# Patient Record
Sex: Male | Born: 1975 | Race: Black or African American | Hispanic: No | Marital: Single | Smoking: Current every day smoker
Health system: Southern US, Community
[De-identification: ages and names within clinical notes are randomized; demographics above are authoritative.]

## PROBLEM LIST (undated history)

## (undated) DIAGNOSIS — S1191XA Laceration without foreign body of unspecified part of neck, initial encounter: Secondary | ICD-10-CM

---

## 1998-03-28 ENCOUNTER — Emergency Department (HOSPITAL_COMMUNITY): Admission: EM | Admit: 1998-03-28 | Discharge: 1998-03-29 | Payer: Self-pay | Admitting: Internal Medicine

## 1999-02-27 ENCOUNTER — Emergency Department (HOSPITAL_COMMUNITY): Admission: EM | Admit: 1999-02-27 | Discharge: 1999-02-27 | Payer: Self-pay | Admitting: *Deleted

## 2008-05-08 ENCOUNTER — Emergency Department (HOSPITAL_COMMUNITY): Admission: EM | Admit: 2008-05-08 | Discharge: 2008-05-09 | Payer: Self-pay | Admitting: Emergency Medicine

## 2008-05-19 ENCOUNTER — Emergency Department (HOSPITAL_COMMUNITY): Admission: EM | Admit: 2008-05-19 | Discharge: 2008-05-19 | Payer: Self-pay | Admitting: Emergency Medicine

## 2008-12-08 ENCOUNTER — Emergency Department (HOSPITAL_COMMUNITY): Admission: EM | Admit: 2008-12-08 | Discharge: 2008-12-08 | Payer: Self-pay | Admitting: Emergency Medicine

## 2009-08-08 ENCOUNTER — Emergency Department (HOSPITAL_COMMUNITY): Admission: EM | Admit: 2009-08-08 | Discharge: 2009-08-08 | Payer: Self-pay | Admitting: Family Medicine

## 2010-01-12 ENCOUNTER — Emergency Department (HOSPITAL_COMMUNITY): Admission: EM | Admit: 2010-01-12 | Discharge: 2010-01-12 | Payer: Self-pay | Admitting: Emergency Medicine

## 2010-06-17 ENCOUNTER — Emergency Department (HOSPITAL_COMMUNITY): Admission: EM | Admit: 2010-06-17 | Discharge: 2010-06-17 | Payer: Self-pay | Admitting: Emergency Medicine

## 2010-08-30 ENCOUNTER — Emergency Department (HOSPITAL_COMMUNITY)
Admission: EM | Admit: 2010-08-30 | Discharge: 2010-08-31 | Payer: Self-pay | Source: Home / Self Care | Admitting: Emergency Medicine

## 2011-01-10 ENCOUNTER — Emergency Department (HOSPITAL_COMMUNITY): Payer: Self-pay

## 2011-01-10 ENCOUNTER — Emergency Department (HOSPITAL_COMMUNITY)
Admission: EM | Admit: 2011-01-10 | Discharge: 2011-01-10 | Payer: Self-pay | Attending: Emergency Medicine | Admitting: Emergency Medicine

## 2011-01-10 DIAGNOSIS — S81009A Unspecified open wound, unspecified knee, initial encounter: Secondary | ICD-10-CM | POA: Insufficient documentation

## 2011-01-10 DIAGNOSIS — W3400XA Accidental discharge from unspecified firearms or gun, initial encounter: Secondary | ICD-10-CM | POA: Insufficient documentation

## 2011-05-27 HISTORY — PX: NECK SURGERY: SHX720

## 2011-06-26 ENCOUNTER — Emergency Department (HOSPITAL_COMMUNITY): Payer: Self-pay

## 2011-06-26 ENCOUNTER — Emergency Department (HOSPITAL_COMMUNITY)
Admission: EM | Admit: 2011-06-26 | Discharge: 2011-06-26 | Disposition: A | Discharge status: Home / Self Care | Payer: Self-pay | Attending: Emergency Medicine | Admitting: Emergency Medicine

## 2011-06-26 DIAGNOSIS — M542 Cervicalgia: Secondary | ICD-10-CM | POA: Insufficient documentation

## 2011-06-26 DIAGNOSIS — S0180XA Unspecified open wound of other part of head, initial encounter: Secondary | ICD-10-CM | POA: Insufficient documentation

## 2011-06-26 DIAGNOSIS — S1190XA Unspecified open wound of unspecified part of neck, initial encounter: Secondary | ICD-10-CM | POA: Insufficient documentation

## 2011-06-26 DIAGNOSIS — F1010 Alcohol abuse, uncomplicated: Secondary | ICD-10-CM | POA: Insufficient documentation

## 2011-06-26 DIAGNOSIS — D72829 Elevated white blood cell count, unspecified: Secondary | ICD-10-CM | POA: Insufficient documentation

## 2011-06-26 LAB — CBC
HCT: 33.4 % — ABNORMAL LOW (ref 39.0–52.0)
Hemoglobin: 11.3 g/dL — ABNORMAL LOW (ref 13.0–17.0)
MCH: 30.3 pg (ref 26.0–34.0)
MCHC: 33.8 g/dL (ref 30.0–36.0)
MCV: 89.5 fL (ref 78.0–100.0)
Platelets: 183 10*3/uL (ref 150–400)
RBC: 3.73 MIL/uL — ABNORMAL LOW (ref 4.22–5.81)
RDW: 12.3 % (ref 11.5–15.5)
WBC: 14.2 10*3/uL — ABNORMAL HIGH (ref 4.0–10.5)

## 2011-06-26 LAB — BASIC METABOLIC PANEL
BUN: 16 mg/dL (ref 6–23)
CO2: 20 mEq/L (ref 19–32)
Calcium: 8.4 mg/dL (ref 8.4–10.5)
Chloride: 103 mEq/L (ref 96–112)
Creatinine, Ser: 0.8 mg/dL (ref 0.50–1.35)
GFR calc Af Amer: >90 mL/min (ref >90)
GFR calc non Af Amer: >90 mL/min (ref >90)
Glucose, Bld: 92 mg/dL (ref 70–99)
Potassium: 3.4 mEq/L — ABNORMAL LOW (ref 3.5–5.1)
Sodium: 138 mEq/L (ref 135–145)

## 2011-06-26 LAB — DIFFERENTIAL
Basophils Absolute: 0 10*3/uL (ref 0.0–0.1)
Basophils Relative: 0 % (ref 0–1)
Eosinophils Absolute: 0 10*3/uL (ref 0.0–0.7)
Eosinophils Relative: 0 % (ref 0–5)
Lymphocytes Relative: 7 % — ABNORMAL LOW (ref 12–46)
Lymphs Abs: 0.9 10*3/uL (ref 0.7–4.0)
Monocytes Absolute: 0.8 10*3/uL (ref 0.1–1.0)
Monocytes Relative: 6 % (ref 3–12)
Neutro Abs: 12.5 10*3/uL — ABNORMAL HIGH (ref 1.7–7.7)
Neutrophils Relative %: 88 % — ABNORMAL HIGH (ref 43–77)

## 2011-06-26 LAB — ETHANOL: Alcohol, Ethyl (B): 251 mg/dL — ABNORMAL HIGH (ref 0–11)

## 2011-06-26 MED ORDER — IOHEXOL 350 MG/ML SOLN
50.0000 mL | Freq: Once | INTRAVENOUS | Status: AC | PRN
  Administered 2011-06-26: 50 mL via INTRAVENOUS

## 2011-06-29 ENCOUNTER — Inpatient Hospital Stay (HOSPITAL_COMMUNITY)
Admission: EM | Admit: 2011-06-29 | Discharge: 2011-07-01 | DRG: 603 | Disposition: A | Discharge status: Home / Self Care | Payer: Self-pay | Attending: General Surgery | Admitting: General Surgery

## 2011-06-29 ENCOUNTER — Emergency Department (HOSPITAL_COMMUNITY): Payer: Self-pay

## 2011-06-29 ENCOUNTER — Emergency Department (HOSPITAL_COMMUNITY)
Admission: EM | Admit: 2011-06-29 | Discharge: 2011-06-29 | Disposition: A | Discharge status: Other Acute Inpatient Hospital | Payer: Self-pay | Attending: Emergency Medicine | Admitting: Emergency Medicine

## 2011-06-29 DIAGNOSIS — L03221 Cellulitis of neck: Secondary | ICD-10-CM

## 2011-06-29 DIAGNOSIS — S1191XA Laceration without foreign body of unspecified part of neck, initial encounter: Secondary | ICD-10-CM | POA: Diagnosis present

## 2011-06-29 DIAGNOSIS — Z9889 Other specified postprocedural states: Secondary | ICD-10-CM | POA: Insufficient documentation

## 2011-06-29 DIAGNOSIS — L0211 Cutaneous abscess of neck: Secondary | ICD-10-CM | POA: Insufficient documentation

## 2011-06-29 DIAGNOSIS — S1190XA Unspecified open wound of unspecified part of neck, initial encounter: Secondary | ICD-10-CM

## 2011-06-29 DIAGNOSIS — F1010 Alcohol abuse, uncomplicated: Secondary | ICD-10-CM | POA: Diagnosis present

## 2011-06-29 DIAGNOSIS — T8140XA Infection following a procedure, unspecified, initial encounter: Secondary | ICD-10-CM

## 2011-06-29 DIAGNOSIS — D62 Acute posthemorrhagic anemia: Secondary | ICD-10-CM | POA: Diagnosis present

## 2011-06-29 DIAGNOSIS — T148XXA Other injury of unspecified body region, initial encounter: Secondary | ICD-10-CM | POA: Diagnosis present

## 2011-06-29 DIAGNOSIS — R220 Localized swelling, mass and lump, head: Secondary | ICD-10-CM | POA: Insufficient documentation

## 2011-06-29 DIAGNOSIS — F17200 Nicotine dependence, unspecified, uncomplicated: Secondary | ICD-10-CM | POA: Diagnosis present

## 2011-06-29 LAB — POCT I-STAT, CHEM 8
Chloride: 104 mEq/L (ref 96–112)
Creatinine, Ser: 0.9 mg/dL (ref 0.50–1.35)
Glucose, Bld: 88 mg/dL (ref 70–99)
HCT: 35 % — ABNORMAL LOW (ref 39.0–52.0)
Potassium: 3.5 mEq/L (ref 3.5–5.1)
Sodium: 138 mEq/L (ref 135–145)

## 2011-06-29 LAB — DIFFERENTIAL
Eosinophils Relative: 1 % (ref 0–5)
Lymphocytes Relative: 5 % — ABNORMAL LOW (ref 12–46)
Lymphs Abs: 0.7 10*3/uL (ref 0.7–4.0)
Monocytes Relative: 10 % (ref 3–12)
Neutro Abs: 12.2 10*3/uL — ABNORMAL HIGH (ref 1.7–7.7)

## 2011-06-29 LAB — CBC
HCT: 28.4 % — ABNORMAL LOW (ref 39.0–52.0)
Hemoglobin: 10 g/dL — ABNORMAL LOW (ref 13.0–17.0)
MCHC: 35.2 g/dL (ref 30.0–36.0)
RBC: 3.18 MIL/uL — ABNORMAL LOW (ref 4.22–5.81)

## 2011-06-29 MED ORDER — MORPHINE SULFATE 2 MG/ML IJ SOLN
1.0000 mg | INTRAMUSCULAR | Status: DC | PRN
  Administered 2011-06-29: 2 mg via INTRAVENOUS

## 2011-06-29 MED ORDER — IOHEXOL 300 MG/ML  SOLN
100.0000 mL | Freq: Once | INTRAMUSCULAR | Status: AC | PRN
  Administered 2011-06-29: 100 mL via INTRAVENOUS

## 2011-06-29 MED ORDER — SODIUM CHLORIDE 0.9 % IV SOLN
3.0000 g | Freq: Four times a day (QID) | INTRAVENOUS | Status: DC
  Administered 2011-06-30: 3 g via INTRAVENOUS
  Filled 2011-06-29 (×3): qty 3

## 2011-06-29 MED ORDER — ONDANSETRON HCL 4 MG/2ML IJ SOLN: 4.0000 mg | Freq: Four times a day (QID) | INTRAMUSCULAR | Status: DC | PRN

## 2011-06-29 MED ORDER — DEXTROSE-NACL 5-0.9 % IV SOLN
INTRAVENOUS | Status: DC
  Administered 2011-06-30: 05:00:00 via INTRAVENOUS

## 2011-06-30 DIAGNOSIS — Z720 Tobacco use: Secondary | ICD-10-CM | POA: Insufficient documentation

## 2011-06-30 DIAGNOSIS — L089 Local infection of the skin and subcutaneous tissue, unspecified: Secondary | ICD-10-CM | POA: Diagnosis present

## 2011-06-30 DIAGNOSIS — Z7289 Other problems related to lifestyle: Secondary | ICD-10-CM | POA: Insufficient documentation

## 2011-06-30 DIAGNOSIS — S1191XA Laceration without foreign body of unspecified part of neck, initial encounter: Secondary | ICD-10-CM | POA: Diagnosis present

## 2011-06-30 DIAGNOSIS — D62 Acute posthemorrhagic anemia: Secondary | ICD-10-CM | POA: Diagnosis present

## 2011-06-30 LAB — BASIC METABOLIC PANEL
BUN: 10 mg/dL (ref 6–23)
CO2: 25 mEq/L (ref 19–32)
Calcium: 8.6 mg/dL (ref 8.4–10.5)
Creatinine, Ser: 0.87 mg/dL (ref 0.50–1.35)
GFR calc non Af Amer: >90 mL/min (ref >90)
Glucose, Bld: 98 mg/dL (ref 70–99)
Sodium: 140 mEq/L (ref 135–145)

## 2011-06-30 LAB — CBC
Hemoglobin: 9.3 g/dL — ABNORMAL LOW (ref 13.0–17.0)
MCH: 31.2 pg (ref 26.0–34.0)
MCHC: 35 g/dL (ref 30.0–36.0)
MCV: 89.3 fL (ref 78.0–100.0)
RBC: 2.98 MIL/uL — ABNORMAL LOW (ref 4.22–5.81)

## 2011-06-30 MED ORDER — MORPHINE SULFATE 2 MG/ML IJ SOLN: 1.0000 mg | INTRAMUSCULAR | Status: DC | PRN

## 2011-06-30 MED ORDER — MORPHINE SULFATE 2 MG/ML IJ SOLN: 2.0000 mg | INTRAMUSCULAR | Status: DC | PRN

## 2011-06-30 MED ORDER — HYDROCODONE-ACETAMINOPHEN 5-325 MG PO TABS
0.5000 | ORAL_TABLET | ORAL | Status: DC | PRN
  Administered 2011-06-30: 2 via ORAL
  Filled 2011-06-30: qty 2

## 2011-06-30 MED ORDER — MORPHINE SULFATE 4 MG/ML IJ SOLN: 3.0000 mg | INTRAMUSCULAR | Status: DC | PRN

## 2011-06-30 MED ORDER — AMOXICILLIN-POT CLAVULANATE 875-125 MG PO TABS
1.0000 | ORAL_TABLET | Freq: Two times a day (BID) | ORAL | Status: DC
  Administered 2011-06-30 – 2011-07-01 (×3): 1 via ORAL
  Filled 2011-06-30 (×5): qty 1
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
## 2011-07-01 MED ORDER — AMOXICILLIN-POT CLAVULANATE 875-125 MG PO TABS: 1.0000 | ORAL_TABLET | Freq: Two times a day (BID) | ORAL | Status: AC

## 2011-07-01 MED ORDER — HYDROCODONE-ACETAMINOPHEN 5-325 MG PO TABS: 0.5000 | ORAL_TABLET | ORAL | Status: AC | PRN
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
## 2011-07-01 NOTE — Discharge Summary (Signed)
This patient has been seen and I agree with the findings and treatment plan.  Depaul Arizpe O. Shatasha Lambing, III, MD, FACS [phone] (pager) [phone] (direct pager) Trauma Surgeon  

## 2011-07-01 NOTE — Progress Notes (Signed)
Patient discharged home. Home discharge education, no questions verbalized. Patient is alert and oriented, not in any distress, denies pain.
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
## 2011-07-02 LAB — CULTURE, ROUTINE-ABSCESS

## 2011-07-03 NOTE — ED Notes (Addendum)
+   Wound Culture-Patient treated with Augmentin-chart sent to EDP office for review.

## 2011-07-05 ENCOUNTER — Telehealth (HOSPITAL_COMMUNITY): Payer: Self-pay | Admitting: Emergency Medicine

## 2011-07-05 NOTE — ED Notes (Signed)
+   wound abscess Chart reviewed by Rhea Bleacher. Augmentin is adequate coverage for staph /strept infection. Please verify if patient is taking his abx.

## 2011-07-11 NOTE — ED Notes (Signed)
Unable to contact via phone. Letter sent to Epic address. 

## 2011-11-01 ENCOUNTER — Emergency Department (HOSPITAL_COMMUNITY)
Admission: EM | Admit: 2011-11-01 | Discharge: 2011-11-02 | Disposition: A | Discharge status: Rehabilitation Facility | Payer: Self-pay | Attending: Emergency Medicine | Admitting: Emergency Medicine

## 2011-11-01 ENCOUNTER — Encounter (HOSPITAL_COMMUNITY): Payer: Self-pay | Admitting: Emergency Medicine

## 2011-11-01 DIAGNOSIS — F10939 Alcohol use, unspecified with withdrawal, unspecified: Secondary | ICD-10-CM | POA: Insufficient documentation

## 2011-11-01 DIAGNOSIS — F10239 Alcohol dependence with withdrawal, unspecified: Secondary | ICD-10-CM | POA: Insufficient documentation

## 2011-11-01 DIAGNOSIS — F1910 Other psychoactive substance abuse, uncomplicated: Secondary | ICD-10-CM | POA: Insufficient documentation

## 2011-11-01 DIAGNOSIS — F1020 Alcohol dependence, uncomplicated: Secondary | ICD-10-CM | POA: Insufficient documentation

## 2011-11-01 LAB — RAPID URINE DRUG SCREEN, HOSP PERFORMED
Amphetamines: NOT DETECTED
Benzodiazepines: NOT DETECTED
Cocaine: POSITIVE — AB
Opiates: NOT DETECTED
Tetrahydrocannabinol: NOT DETECTED

## 2011-11-01 LAB — URINALYSIS, ROUTINE W REFLEX MICROSCOPIC
Glucose, UA: NEGATIVE mg/dL
Hgb urine dipstick: NEGATIVE
Leukocytes, UA: NEGATIVE
Protein, ur: NEGATIVE mg/dL
Urobilinogen, UA: 1 mg/dL (ref 0.0–1.0)

## 2011-11-01 LAB — CBC
MCH: 31.7 pg (ref 26.0–34.0)
MCHC: 34.6 g/dL (ref 30.0–36.0)
MCV: 91.7 fL (ref 78.0–100.0)
Platelets: 262 10*3/uL (ref 150–400)
RBC: 3.97 MIL/uL — ABNORMAL LOW (ref 4.22–5.81)
RDW: 13.6 % (ref 11.5–15.5)

## 2011-11-01 LAB — DIFFERENTIAL
Basophils Relative: 0 % (ref 0–1)
Eosinophils Absolute: 0.1 10*3/uL (ref 0.0–0.7)
Eosinophils Relative: 1 % (ref 0–5)
Lymphs Abs: 1.8 10*3/uL (ref 0.7–4.0)
Neutrophils Relative %: 56 % (ref 43–77)

## 2011-11-01 LAB — BASIC METABOLIC PANEL
Calcium: 9.4 mg/dL (ref 8.4–10.5)
GFR calc Af Amer: >90 mL/min (ref >90)
GFR calc non Af Amer: 86 mL/min — ABNORMAL LOW (ref >90)
Glucose, Bld: 123 mg/dL — ABNORMAL HIGH (ref 70–99)
Potassium: 4 mEq/L (ref 3.5–5.1)
Sodium: 134 mEq/L — ABNORMAL LOW (ref 135–145)

## 2011-11-01 MED ORDER — FOLIC ACID 1 MG PO TABS
1.0000 mg | ORAL_TABLET | Freq: Every day | ORAL | Status: DC
  Administered 2011-11-01: 1 mg via ORAL
  Filled 2011-11-01: qty 1

## 2011-11-01 MED ORDER — LORAZEPAM 1 MG PO TABS
1.0000 mg | ORAL_TABLET | Freq: Once | ORAL | Status: AC
  Administered 2011-11-01: 1 mg via ORAL
  Filled 2011-11-01: qty 1

## 2011-11-01 MED ORDER — ONDANSETRON HCL 4 MG PO TABS: 4.0000 mg | ORAL_TABLET | Freq: Three times a day (TID) | ORAL | Status: DC | PRN

## 2011-11-01 MED ORDER — ALUM & MAG HYDROXIDE-SIMETH 200-200-20 MG/5ML PO SUSP: 30.0000 mL | ORAL | Status: DC | PRN

## 2011-11-01 MED ORDER — IBUPROFEN 600 MG PO TABS: 600.0000 mg | ORAL_TABLET | Freq: Three times a day (TID) | ORAL | Status: DC | PRN

## 2011-11-01 MED ORDER — ADULT MULTIVITAMIN W/MINERALS CH
1.0000 | ORAL_TABLET | Freq: Every day | ORAL | Status: DC
  Administered 2011-11-01: 1 via ORAL
  Filled 2011-11-01: qty 1

## 2011-11-01 MED ORDER — NICOTINE 21 MG/24HR TD PT24
21.0000 mg | MEDICATED_PATCH | Freq: Every day | TRANSDERMAL | Status: DC
  Administered 2011-11-01: 21 mg via TRANSDERMAL
  Filled 2011-11-01: qty 1

## 2011-11-01 MED ORDER — LORAZEPAM 1 MG PO TABS: 0.0000 mg | ORAL_TABLET | Freq: Four times a day (QID) | ORAL | Status: DC

## 2011-11-01 MED ORDER — LORAZEPAM 1 MG PO TABS: 0.0000 mg | ORAL_TABLET | Freq: Two times a day (BID) | ORAL | Status: DC

## 2011-11-01 MED ORDER — THIAMINE HCL 100 MG/ML IJ SOLN: 100.0000 mg | Freq: Every day | INTRAMUSCULAR | Status: DC

## 2011-11-01 MED ORDER — VITAMIN B-1 100 MG PO TABS
100.0000 mg | ORAL_TABLET | Freq: Every day | ORAL | Status: DC
  Administered 2011-11-01: 100 mg via ORAL
  Filled 2011-11-01: qty 1

## 2011-11-01 NOTE — ED Provider Notes (Signed)
History     CSN: [id number]  Arrival date & time 11/01/11  1803   First MD Initiated Contact with Patient 11/01/11 1848      Chief Complaint  Patient presents with  . Medical Clearance    wants detox from ETOH, crack and marajuana    (Consider location/radiation/quality/duration/timing/severity/associated sxs/prior treatment) HPI History provided by pt.   Pt requests detox from alcohol.  Wakes every morning w/ tremors and diaphoresis which resolves as soon as he drinks a beer, and he does not stop drinking until he goes to bed.  Has been drinking on a daily basis for the past 2 months.  Last drink yesterday evening.  Currently experiencing tremors, paresthesias of hands, diaphoresis, abdominal cramping and nausea.  Denies hallucinations.  No prior h/o DTs/seizures.  Pt also abuses marijuana and cocaine.  Attempted self-detox several months ago and was clean for two months but began to abuse again after going through a stressful situation. Denies SI/HI.    History reviewed. No pertinent past medical history.  History reviewed. No pertinent past surgical history.  No family history on file.  History  Substance Use Topics  . Smoking status: Not on file  . Smokeless tobacco: Not on file  . Alcohol Use: Not on file      Review of Systems  All other systems reviewed and are negative.    Allergies  Review of patient's allergies indicates no known allergies.  Home Medications  No current outpatient prescriptions on file.  BP 128/71  Pulse 117  Temp 99.3 F (37.4 C)  Resp 20  SpO2 99%  Physical Exam  Nursing note and vitals reviewed. Constitutional: He is oriented to person, place, and time. He appears well-developed and well-nourished. No distress.  HENT:  Head: Normocephalic and atraumatic.  Eyes:       Normal appearance  Neck: Normal range of motion.  Cardiovascular: Normal rate and regular rhythm.   Pulmonary/Chest: Effort normal and breath sounds normal.    Neurological: He is alert and oriented to person, place, and time.       Slight, bilateral hand tremor  Skin: Skin is warm and dry. No rash noted.       No diaphoresis  Psychiatric: He has a normal mood and affect. His behavior is normal.    ED Course  Procedures (including critical care time)  Labs Reviewed  URINE RAPID DRUG SCREEN (HOSP PERFORMED) - Abnormal; Notable for the following:    Cocaine POSITIVE (*)    All other components within normal limits  CBC - Abnormal; Notable for the following:    RBC 3.97 (*)    Hemoglobin 12.6 (*)    HCT 36.4 (*)    All other components within normal limits  DIFFERENTIAL - Abnormal; Notable for the following:    Monocytes Relative 14 (*)    All other components within normal limits  BASIC METABOLIC PANEL - Abnormal; Notable for the following:    Sodium 134 (*)    Glucose, Bld 123 (*)    GFR calc non Af Amer 86 (*)    All other components within normal limits  URINALYSIS, ROUTINE W REFLEX MICROSCOPIC - Abnormal; Notable for the following:    Ketones, ur TRACE (*)    All other components within normal limits  ETHANOL   No results found.   1. Alcoholism   2. Polysubstance abuse       MDM  Pt presents for alcohol detox.  Experiencing withdrawal sx currently.  Abuses several other substances.  Denies SI/HI.  Mobile Crisis Team is present and will find placement for patient but he needs medical clearance.  Labs pending.  Psych holding and alcohol withdrawal orders have been written.  ACT team consulted.          Otilio Miu, [place] 11/01/11 2210

## 2011-11-01 NOTE — ED Notes (Addendum)
Wants detox from ETOH, crack, cocaine and marajuana.  Denies SI or HI.  Pt called mobile crisis unit who is here now.  Last used ETOH, crack and cocaine last night.  Last Brooke Bonito was about 3 days ago.  Also c/o nause, muscle cramps, sweats, tremors/shakes, numbness to hands.  Denies hallucinations.

## 2011-11-01 NOTE — ED Notes (Signed)
Pt is here for medical clearance for Mobile Crisis for cocaine and alcohol addiction.

## 2011-11-02 NOTE — Discharge Instructions (Signed)
Chemical Dependency Chemical dependency is an addiction to drugs or alcohol. It is characterized by the repeated behavior of seeking out and using drugs and alcohol despite harmful consequences to the health and safety of ones self and others.  RISK FACTORS There are certain situations or behaviors that increase a person's risk for chemical dependency. These include:  A family history of chemical dependency.   A history of mental health issues, including depression and anxiety.   A home environment where drugs and alcohol are easily available to you.   Drug or alcohol use at a young age.  SYMPTOMS  The following symptoms can indicate chemical dependency:  Inability to limit the use of drugs or alcohol.   Nausea, sweating, shakiness, and anxiety that occurs when alcohol or drugs are not being used.   An increase in amount of drugs or alcohol that is necessary to get drunk or high.  People who experience these symptoms can assess their use of drugs and alcohol by asking themselves the following questions:  Have you been told by friends or family that they are worried about your use of alcohol or drugs?   Do friends and family ever tell you about things you did while drinking alcohol or using drugs that you do not remember?   Do you lie about using alcohol or drugs or about the amounts you use?   Do you have difficulty completing daily tasks unless you use alcohol or drugs?   Is the level of your work or school performance lower because of your drug or alcohol use?   Do you get sick from using drugs or alcohol but keep using anyway?   Do you feel uncomfortable in social situations unless you use alcohol or drugs?   Do you use drugs or alcohol to help forget problems?  An answer of yes to any of these questions may indicate chemical dependency. Professional evaluation is suggested. Document Released: 08/06/2001 Document Revised: 08/01/2011 Document Reviewed: 10/18/2010 ExitCare  Patient Information 2012 [employer]. 

## 2011-11-02 NOTE — ED Provider Notes (Signed)
Medical screening examination/treatment/procedure(s) were performed by non-physician practitioner and as supervising physician I was immediately available for consultation/collaboration.  Geoffery Lyons, MD 11/02/11 [phone]

## 2011-12-06 ENCOUNTER — Encounter (HOSPITAL_BASED_OUTPATIENT_CLINIC_OR_DEPARTMENT_OTHER): Payer: Self-pay | Admitting: *Deleted

## 2011-12-06 ENCOUNTER — Emergency Department (HOSPITAL_BASED_OUTPATIENT_CLINIC_OR_DEPARTMENT_OTHER)
Admission: EM | Admit: 2011-12-06 | Discharge: 2011-12-06 | Disposition: A | Discharge status: Home / Self Care | Payer: Self-pay | Attending: Emergency Medicine | Admitting: Emergency Medicine

## 2011-12-06 DIAGNOSIS — H109 Unspecified conjunctivitis: Secondary | ICD-10-CM

## 2011-12-06 DIAGNOSIS — H00019 Hordeolum externum unspecified eye, unspecified eyelid: Secondary | ICD-10-CM | POA: Insufficient documentation

## 2011-12-06 DIAGNOSIS — F17200 Nicotine dependence, unspecified, uncomplicated: Secondary | ICD-10-CM | POA: Insufficient documentation

## 2011-12-06 MED ORDER — TOBRAMYCIN 0.3 % OP SOLN: 2.0000 [drp] | OPHTHALMIC | Status: DC

## 2011-12-06 MED ORDER — TOBRAMYCIN 0.3 % OP SOLN
OPHTHALMIC | Status: AC
  Filled 2011-12-06: qty 5

## 2011-12-06 NOTE — Discharge Instructions (Signed)
Conjunctivitis  Conjunctivitis is commonly called "pink eye." Conjunctivitis can be caused by bacterial or viral infection, allergies, or injuries. There is usually redness of the lining of the eye, itching, discomfort, and sometimes discharge. There may be deposits of matter along the eyelids. A viral infection usually causes a watery discharge, while a bacterial infection causes a yellowish, thick discharge. Pink eye is very contagious and spreads by direct contact.  You may be given antibiotic eyedrops as part of your treatment. Before using your eye medicine, remove all drainage from the eye by washing gently with warm water and cotton balls. Continue to use the medication until you have awakened 2 mornings in a row without discharge from the eye. Do not rub your eye. This increases the irritation and helps spread infection. Use separate towels from other household members. Wash your hands with soap and water before and after touching your eyes. Use cold compresses to reduce pain and sunglasses to relieve irritation from light. Do not wear contact lenses or wear eye makeup until the infection is gone.  SEEK MEDICAL CARE IF:    Your symptoms are not better after 3 days of treatment.   You have increased pain or trouble seeing.   The outer eyelids become very red or swollen.  Document Released: 09/19/2004 Document Revised: 08/01/2011 Document Reviewed: 08/12/2005  ExitCare Patient Information 2012 [employer].  Sty  A sty (hordeolum) is an infection of a gland in the eyelid located at the base of the eyelash. A sty may develop a white or yellow head of pus. It can be puffy (swollen). Usually, the sty will burst and pus will come out on its own. They do not leave lumps in the eyelid once they drain.  A sty is often confused with another form of cyst of the eyelid called a chalazion. Chalazions occur within the eyelid and not on the edge where the bases of the eyelashes are. They often are red, sore and then  form firm lumps in the eyelid.  CAUSES    Germs (bacteria).   Lasting (chronic) eyelid inflammation.  SYMPTOMS    Tenderness, redness and swelling along the edge of the eyelid at the base of the eyelashes.   Sometimes, there is a white or yellow head of pus. It may or may not drain.  DIAGNOSIS   An ophthalmologist will be able to distinguish between a sty and a chalazion and treat the condition appropriately.   TREATMENT    Styes are typically treated with warm packs (compresses) until drainage occurs.   In rare cases, medicines that kill germs (antibiotics) may be prescribed. These antibiotics may be in the form of drops, cream or pills.   If a hard lump has formed, it is generally necessary to do a small incision and remove the hardened contents of the cyst in a minor surgical procedure done in the office.   In suspicious cases, your caregiver may send the contents of the cyst to the lab to be certain that it is not a rare, but dangerous form of cancer of the glands of the eyelid.  HOME CARE INSTRUCTIONS    Wash your hands often and dry them with a clean towel. Avoid touching your eyelid. This may spread the infection to other parts of the eye.   Apply heat to your eyelid for 10 to 20 minutes, several times a day, to ease pain and help to heal it faster.   Do not squeeze the sty. Allow it   to drain on its own. Wash your eyelid carefully 3 to 4 times per day to remove any pus.  SEEK IMMEDIATE MEDICAL CARE IF:    Your eye becomes painful or puffy (swollen).   Your vision changes.   Your sty does not drain by itself within 3 days.   Your sty comes back within a short period of time, even with treatment.   You have redness (inflammation) around the eye.   You have a fever.  Document Released: 05/22/2005 Document Revised: 08/01/2011 Document Reviewed: 01/24/2009  ExitCare Patient Information 2012 [employer].

## 2011-12-06 NOTE — ED Notes (Signed)
Eyes red swollen and painful. Things his seasonal allergies are the problem.

## 2011-12-06 NOTE — ED Provider Notes (Signed)
Medical screening examination/treatment/procedure(s) were performed by non-physician practitioner and as supervising physician I was immediately available for consultation/collaboration.   Glynn Octave, MD 12/06/11 1515

## 2011-12-06 NOTE — ED Notes (Signed)
Pt states he is a patient at [facility] drug rehab facility.

## 2011-12-06 NOTE — ED Provider Notes (Signed)
History     CSN: [id number]  Arrival date & time 12/06/11  1316   First MD Initiated Contact with Patient 12/06/11 1329      Chief Complaint  Patient presents with  . Conjunctivitis    (Consider location/radiation/quality/duration/timing/severity/associated sxs/prior treatment) Patient is a 36 y.o. male presenting with conjunctivitis. The history is provided by the patient. No language interpreter was used.  Conjunctivitis  The current episode started 5 to 7 days ago. The onset was gradual. The problem has been gradually worsening. The problem is moderate. The symptoms are relieved by nothing. The symptoms are aggravated by nothing. Associated symptoms include eye itching, eye discharge and eye redness. The eye pain is moderate. There is pain in both eyes. The eye pain is not associated with movement. He has been eating and drinking normally.  Pt has styes on bilat lower eyelids.  Pt complains of redness to eyes  History reviewed. No pertinent past medical history.  History reviewed. No pertinent past surgical history.  No family history on file.  History  Substance Use Topics  . Smoking status: Current Everyday Smoker  . Smokeless tobacco: Not on file  . Alcohol Use: Yes      Review of Systems  Eyes: Positive for discharge, redness and itching.  All other systems reviewed and are negative.    Allergies  Review of patient's allergies indicates no known allergies.  Home Medications  No current outpatient prescriptions on file.  BP 116/68  Pulse 90  Temp(Src) 98.4 F (36.9 C) (Oral)  Resp 22  SpO2 99%  Physical Exam  Constitutional: He appears well-developed and well-nourished.  HENT:  Head: Normocephalic and atraumatic.  Right Ear: External ear normal.  Left Ear: External ear normal.  Nose: Nose normal.  Mouth/Throat: Oropharynx is clear and moist.  Eyes:       Injected bilat conjunctiva,  Stye's on both lower eyelids  Neck: Normal range of motion. Neck  supple.  Musculoskeletal: Normal range of motion.  Skin: Skin is warm.  Psychiatric: He has a normal mood and affect.    ED Course  Procedures (including critical care time)  Labs Reviewed - No data to display No results found.   No diagnosis found.    MDM  tobrex drops given here.   Pt advised zyrtec for allergy like symptoms.      Lonia Skinner [place], [place] 12/06/11 1413

## 2011-12-13 ENCOUNTER — Telehealth (HOSPITAL_BASED_OUTPATIENT_CLINIC_OR_DEPARTMENT_OTHER): Payer: Self-pay | Admitting: *Deleted

## 2011-12-13 NOTE — ED Notes (Signed)
Patient called to state he was seen here on 12/08/11 and treated for an eye infection.  States he was supposed to receive a prescription for additional medications.  Reviewed chart and found no other prescriptions that were prescribed.  Called [facility] and spoke with the Social Worker, Casimiro Needle.  Info given about no other prescriptions and if patient is still having problems, he is to follow up with his pcp.

## 2012-03-17 ENCOUNTER — Emergency Department (HOSPITAL_COMMUNITY): Payer: Self-pay

## 2012-03-17 ENCOUNTER — Observation Stay (HOSPITAL_COMMUNITY)
Admission: EM | Admit: 2012-03-17 | Discharge: 2012-03-18 | Disposition: A | Discharge status: Home / Self Care | Payer: Self-pay

## 2012-03-17 ENCOUNTER — Encounter (HOSPITAL_COMMUNITY): Payer: Self-pay

## 2012-03-17 DIAGNOSIS — Y9289 Other specified places as the place of occurrence of the external cause: Secondary | ICD-10-CM | POA: Insufficient documentation

## 2012-03-17 DIAGNOSIS — D62 Acute posthemorrhagic anemia: Secondary | ICD-10-CM | POA: Diagnosis not present

## 2012-03-17 DIAGNOSIS — Y998 Other external cause status: Secondary | ICD-10-CM | POA: Insufficient documentation

## 2012-03-17 DIAGNOSIS — S31109A Unspecified open wound of abdominal wall, unspecified quadrant without penetration into peritoneal cavity, initial encounter: Secondary | ICD-10-CM

## 2012-03-17 DIAGNOSIS — S21219A Laceration without foreign body of unspecified back wall of thorax without penetration into thoracic cavity, initial encounter: Secondary | ICD-10-CM | POA: Diagnosis present

## 2012-03-17 DIAGNOSIS — S21209A Unspecified open wound of unspecified back wall of thorax without penetration into thoracic cavity, initial encounter: Principal | ICD-10-CM | POA: Insufficient documentation

## 2012-03-17 HISTORY — DX: Laceration without foreign body of unspecified part of neck, initial encounter: S11.91XA

## 2012-03-17 LAB — POCT I-STAT, CHEM 8
BUN: 13 mg/dL (ref 6–23)
Calcium, Ion: 1.13 mmol/L (ref 1.12–1.23)
Chloride: 108 mEq/L (ref 96–112)
Creatinine, Ser: 1.6 mg/dL — ABNORMAL HIGH (ref 0.50–1.35)
Glucose, Bld: 85 mg/dL (ref 70–99)
HCT: 42 % (ref 39.0–52.0)
Hemoglobin: 14.3 g/dL (ref 13.0–17.0)
Potassium: 3.8 mEq/L (ref 3.5–5.1)
Sodium: 139 meq/L (ref 135–145)
TCO2: 13 mmol/L (ref 0–100)

## 2012-03-17 LAB — URINALYSIS, MICROSCOPIC ONLY
Bilirubin Urine: NEGATIVE
Glucose, UA: NEGATIVE mg/dL
Specific Gravity, Urine: 1.015 (ref 1.005–1.030)
Urobilinogen, UA: 0.2 mg/dL (ref 0.0–1.0)

## 2012-03-17 LAB — ABO/RH: ABO/RH(D): A POS

## 2012-03-17 LAB — RAPID URINE DRUG SCREEN, HOSP PERFORMED
Amphetamines: NOT DETECTED
Barbiturates: NOT DETECTED
Benzodiazepines: NOT DETECTED
Cocaine: POSITIVE — AB
Opiates: NOT DETECTED
Tetrahydrocannabinol: POSITIVE — AB

## 2012-03-17 LAB — CBC
HCT: 38.8 % — ABNORMAL LOW (ref 39.0–52.0)
Hemoglobin: 13.5 g/dL (ref 13.0–17.0)
MCHC: 34.8 g/dL (ref 30.0–36.0)
MCV: 91.9 fL (ref 78.0–100.0)
RDW: 13.1 % (ref 11.5–15.5)
WBC: 8.7 10*3/uL (ref 4.0–10.5)

## 2012-03-17 LAB — COMPREHENSIVE METABOLIC PANEL
ALT: 14 U/L (ref 0–53)
Albumin: 4 g/dL (ref 3.5–5.2)
Alkaline Phosphatase: 69 U/L (ref 39–117)
Calcium: 9 mg/dL (ref 8.4–10.5)
Potassium: 3.8 mEq/L (ref 3.5–5.1)
Sodium: 138 mEq/L (ref 135–145)
Total Protein: 8 g/dL (ref 6.0–8.3)

## 2012-03-17 LAB — ETHANOL: Alcohol, Ethyl (B): 194 mg/dL — ABNORMAL HIGH (ref 0–11)

## 2012-03-17 LAB — LACTIC ACID, PLASMA: Lactic Acid, Venous: 13.9 mmol/L — ABNORMAL HIGH (ref 0.5–2.2)

## 2012-03-17 MED ORDER — SODIUM CHLORIDE 0.9 % IV BOLUS (SEPSIS): 1000.0000 mL | Freq: Once | INTRAVENOUS | Status: DC

## 2012-03-17 MED ORDER — IOHEXOL 300 MG/ML  SOLN
100.0000 mL | Freq: Once | INTRAMUSCULAR | Status: AC | PRN
  Administered 2012-03-17: 100 mL via INTRAVENOUS

## 2012-03-17 MED ORDER — CEFAZOLIN SODIUM 1-5 GM-% IV SOLN
1.0000 g | Freq: Once | INTRAVENOUS | Status: AC
  Administered 2012-03-17: 1 g via INTRAVENOUS
  Filled 2012-03-17: qty 50

## 2012-03-17 MED ORDER — FENTANYL CITRATE 0.05 MG/ML IJ SOLN
50.0000 ug | Freq: Once | INTRAMUSCULAR | Status: AC
  Administered 2012-03-17: 50 ug via INTRAVENOUS
  Filled 2012-03-17: qty 2

## 2012-03-17 MED ORDER — HYDROMORPHONE HCL PF 1 MG/ML IJ SOLN
1.0000 mg | Freq: Once | INTRAMUSCULAR | Status: AC
  Administered 2012-03-18: 1 mg via INTRAVENOUS
  Filled 2012-03-17: qty 1

## 2012-03-17 MED ORDER — LACTATED RINGERS IV SOLN
INTRAVENOUS | Status: AC | PRN
  Administered 2012-03-17: 125 mL/h via INTRAVENOUS

## 2012-03-17 MED ORDER — FENTANYL CITRATE 0.05 MG/ML IJ SOLN: 50.0000 ug | Freq: Once | INTRAMUSCULAR | Status: DC
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
## 2012-03-17 NOTE — ED Notes (Signed)
Staff notified that family is in waiting. Jaci, RN will update them as to patient's status

## 2012-03-17 NOTE — ED Notes (Signed)
Transported patient to CT 2 on monitor, accompanied by nurse
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
## 2012-03-17 NOTE — Progress Notes (Signed)
Responded to LVL 1 trauma page.  Chaplain assistance is not needed at this time.  Please page me if assistance is needed. Jya Hughston  [phone] oncall pager

## 2012-03-17 NOTE — ED Notes (Signed)
Portable chest x ray completed.
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
## 2012-03-18 DIAGNOSIS — D62 Acute posthemorrhagic anemia: Secondary | ICD-10-CM | POA: Diagnosis not present

## 2012-03-18 DIAGNOSIS — S21219A Laceration without foreign body of unspecified back wall of thorax without penetration into thoracic cavity, initial encounter: Secondary | ICD-10-CM | POA: Diagnosis present

## 2012-03-18 LAB — TYPE AND SCREEN
ABO/RH(D): A POS
Antibody Screen: NEGATIVE
Unit division: 0
Unit division: 0

## 2012-03-18 LAB — CBC
HCT: 34.2 % — ABNORMAL LOW (ref 39.0–52.0)
Hemoglobin: 11.9 g/dL — ABNORMAL LOW (ref 13.0–17.0)
MCH: 31.5 pg (ref 26.0–34.0)
MCHC: 34.8 g/dL (ref 30.0–36.0)
RDW: 13.4 % (ref 11.5–15.5)

## 2012-03-18 MED ORDER — METHOCARBAMOL 500 MG PO TABS: 1000.0000 mg | ORAL_TABLET | Freq: Four times a day (QID) | ORAL | Status: AC | PRN

## 2012-03-18 MED ORDER — PNEUMOCOCCAL VAC POLYVALENT 25 MCG/0.5ML IJ INJ: 0.5000 mL | INJECTION | INTRAMUSCULAR | Status: DC

## 2012-03-18 MED ORDER — MORPHINE SULFATE 4 MG/ML IJ SOLN
INTRAMUSCULAR | Status: AC
  Administered 2012-03-18: 2 mg
  Filled 2012-03-18: qty 1

## 2012-03-18 MED ORDER — CHLORHEXIDINE GLUCONATE 0.12 % MT SOLN
15.0000 mL | Freq: Two times a day (BID) | OROMUCOSAL | Status: DC
  Administered 2012-03-18: 15 mL via OROMUCOSAL
  Filled 2012-03-18: qty 15

## 2012-03-18 MED ORDER — HYDROCODONE-ACETAMINOPHEN 5-325 MG PO TABS
1.0000 | ORAL_TABLET | ORAL | Status: DC | PRN
  Administered 2012-03-18: 2 via ORAL
  Filled 2012-03-18: qty 2

## 2012-03-18 MED ORDER — KCL IN DEXTROSE-NACL 20-5-0.45 MEQ/L-%-% IV SOLN
INTRAVENOUS | Status: DC
  Administered 2012-03-18: 02:00:00 via INTRAVENOUS
  Filled 2012-03-18 (×3): qty 1000

## 2012-03-18 MED ORDER — ONDANSETRON HCL 4 MG/2ML IJ SOLN: 4.0000 mg | Freq: Four times a day (QID) | INTRAMUSCULAR | Status: DC | PRN

## 2012-03-18 MED ORDER — MORPHINE SULFATE 2 MG/ML IJ SOLN
INTRAMUSCULAR | Status: AC
  Filled 2012-03-18: qty 1

## 2012-03-18 MED ORDER — MORPHINE SULFATE 4 MG/ML IJ SOLN
6.0000 mg | Freq: Once | INTRAMUSCULAR | Status: AC
Start: 2012-03-18 — End: 2012-03-18
  Administered 2012-03-18: 6 mg via INTRAVENOUS

## 2012-03-18 MED ORDER — MORPHINE SULFATE 2 MG/ML IJ SOLN
2.0000 mg | Freq: Once | INTRAMUSCULAR | Status: AC
  Administered 2012-03-18: 2 mg via INTRAVENOUS

## 2012-03-18 MED ORDER — BIOTENE DRY MOUTH MT LIQD: 15.0000 mL | Freq: Two times a day (BID) | OROMUCOSAL | Status: DC

## 2012-03-18 MED ORDER — TRAMADOL HCL 50 MG PO TABS
100.0000 mg | ORAL_TABLET | Freq: Four times a day (QID) | ORAL | Status: DC | PRN
  Administered 2012-03-18: 100 mg via ORAL
  Filled 2012-03-18: qty 2

## 2012-03-18 MED ORDER — METHOCARBAMOL 750 MG PO TABS
1500.0000 mg | ORAL_TABLET | Freq: Four times a day (QID) | ORAL | Status: DC
  Administered 2012-03-18 (×2): 1500 mg via ORAL
  Filled 2012-03-18 (×4): qty 2

## 2012-03-18 MED ORDER — NAPROXEN 500 MG PO TABS: 500.0000 mg | ORAL_TABLET | Freq: Two times a day (BID) | ORAL | Status: AC

## 2012-03-18 MED ORDER — OXYCODONE-ACETAMINOPHEN 10-325 MG PO TABS: 1.0000 | ORAL_TABLET | Freq: Four times a day (QID) | ORAL | Status: AC | PRN

## 2012-03-18 MED ORDER — OXYCODONE HCL 5 MG PO TABS: 20.0000 mg | ORAL_TABLET | Freq: Once | ORAL | Status: DC

## 2012-03-18 MED ORDER — MORPHINE SULFATE 4 MG/ML IJ SOLN
INTRAMUSCULAR | Status: AC
  Filled 2012-03-18: qty 2

## 2012-03-18 MED ORDER — BACITRACIN ZINC 500 UNIT/GM EX OINT
TOPICAL_OINTMENT | Freq: Two times a day (BID) | CUTANEOUS | Status: DC
  Administered 2012-03-18: 1 via TOPICAL
  Filled 2012-03-18: qty 15
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
## 2012-03-18 NOTE — Progress Notes (Signed)
UR completed 

## 2012-03-18 NOTE — Progress Notes (Signed)
Patient doing much better with narcotic medications.  Will send home with Oxy IR,  This patient has been seen and I agree with the findings and treatment plan.  Marta Lamas. Gae Bon, MD, FACS [phone] (pager) [phone] (direct pager) Trauma Surgeon
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
## 2012-03-18 NOTE — Discharge Summary (Signed)
Patient's pain is well controlled now.  Okay to go home.  This patient has been seen and I agree with the findings and treatment plan.  Marta Lamas. Gae Bon, MD, FACS [phone] (pager) [phone] (direct pager) Trauma Surgeon

## 2012-04-01 ENCOUNTER — Encounter (HOSPITAL_BASED_OUTPATIENT_CLINIC_OR_DEPARTMENT_OTHER): Payer: Self-pay | Admitting: *Deleted

## 2016-07-15 ENCOUNTER — Emergency Department (HOSPITAL_COMMUNITY)
Admission: EM | Admit: 2016-07-15 | Discharge: 2016-07-15 | Disposition: A | Discharge status: Home / Self Care | Payer: Self-pay | Attending: Emergency Medicine | Admitting: Emergency Medicine

## 2016-07-15 ENCOUNTER — Emergency Department (HOSPITAL_COMMUNITY): Payer: Self-pay

## 2016-07-15 ENCOUNTER — Encounter (HOSPITAL_COMMUNITY): Payer: Self-pay | Admitting: Emergency Medicine

## 2016-07-15 DIAGNOSIS — F17210 Nicotine dependence, cigarettes, uncomplicated: Secondary | ICD-10-CM | POA: Insufficient documentation

## 2016-07-15 DIAGNOSIS — J40 Bronchitis, not specified as acute or chronic: Secondary | ICD-10-CM | POA: Insufficient documentation

## 2016-07-15 LAB — COMPREHENSIVE METABOLIC PANEL
ALBUMIN: 4.2 g/dL (ref 3.5–5.0)
ALT: 69 U/L — ABNORMAL HIGH (ref 17–63)
ANION GAP: 11 (ref 5–15)
AST: 57 U/L — ABNORMAL HIGH (ref 15–41)
Alkaline Phosphatase: 65 U/L (ref 38–126)
BUN: 13 mg/dL (ref 6–20)
CHLORIDE: 104 mmol/L (ref 101–111)
CO2: 23 mmol/L (ref 22–32)
Calcium: 9.6 mg/dL (ref 8.9–10.3)
Creatinine, Ser: 1.02 mg/dL (ref 0.61–1.24)
GFR calc Af Amer: >60 mL/min (ref >60)
GFR calc non Af Amer: >60 mL/min (ref >60)
GLUCOSE: 104 mg/dL — AB (ref 65–99)
POTASSIUM: 3.9 mmol/L (ref 3.5–5.1)
SODIUM: 138 mmol/L (ref 135–145)
Total Bilirubin: 0.5 mg/dL (ref 0.3–1.2)
Total Protein: 7.3 g/dL (ref 6.5–8.1)

## 2016-07-15 LAB — CBC WITH DIFFERENTIAL/PLATELET
BASOS PCT: 0 %
Basophils Absolute: 0 10*3/uL (ref 0.0–0.1)
EOS ABS: 0.1 10*3/uL (ref 0.0–0.7)
Eosinophils Relative: 1 %
HCT: 39.5 % (ref 39.0–52.0)
HEMOGLOBIN: 13.8 g/dL (ref 13.0–17.0)
Lymphocytes Relative: 24 %
Lymphs Abs: 2.1 10*3/uL (ref 0.7–4.0)
MCH: 31 pg (ref 26.0–34.0)
MCHC: 34.9 g/dL (ref 30.0–36.0)
MCV: 88.8 fL (ref 78.0–100.0)
MONO ABS: 0.7 10*3/uL (ref 0.1–1.0)
MONOS PCT: 8 %
NEUTROS PCT: 67 %
Neutro Abs: 5.7 10*3/uL (ref 1.7–7.7)
Platelets: 281 10*3/uL (ref 150–400)
RBC: 4.45 MIL/uL (ref 4.22–5.81)
RDW: 12.5 % (ref 11.5–15.5)
WBC: 8.6 10*3/uL (ref 4.0–10.5)

## 2016-07-15 LAB — I-STAT TROPONIN, ED: TROPONIN I, POC: 0 ng/mL (ref 0.00–0.08)

## 2016-07-15 MED ORDER — BENZONATATE 100 MG PO CAPS: 100.0000 mg | ORAL_CAPSULE | Freq: Three times a day (TID) | ORAL | 0 refills | Status: DC

## 2016-07-15 MED ORDER — GUAIFENESIN ER 600 MG PO TB12: 600.0000 mg | ORAL_TABLET | Freq: Two times a day (BID) | ORAL | 0 refills | Status: DC

## 2016-07-15 NOTE — ED Triage Notes (Signed)
Pt. reports intermittent central chest tightness with SOB for several weeks , denies emesis or diaphoresis , pt. added chronic low back pain for for several months , denies injury .

## 2016-07-15 NOTE — Discharge Instructions (Signed)
Take medications as needed for the cough and congestion. Follow-up with the primary doctor if not better in a week or 2.
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
## 2017-02-21 ENCOUNTER — Emergency Department (HOSPITAL_COMMUNITY): Payer: Self-pay

## 2017-02-21 ENCOUNTER — Encounter (HOSPITAL_COMMUNITY): Payer: Self-pay | Admitting: Emergency Medicine

## 2017-02-21 ENCOUNTER — Emergency Department (HOSPITAL_COMMUNITY)
Admission: EM | Admit: 2017-02-21 | Discharge: 2017-02-21 | Disposition: A | Discharge status: Home / Self Care | Payer: Self-pay | Attending: Emergency Medicine | Admitting: Emergency Medicine

## 2017-02-21 DIAGNOSIS — W2209XA Striking against other stationary object, initial encounter: Secondary | ICD-10-CM | POA: Insufficient documentation

## 2017-02-21 DIAGNOSIS — F17210 Nicotine dependence, cigarettes, uncomplicated: Secondary | ICD-10-CM | POA: Insufficient documentation

## 2017-02-21 DIAGNOSIS — Z79899 Other long term (current) drug therapy: Secondary | ICD-10-CM | POA: Insufficient documentation

## 2017-02-21 DIAGNOSIS — S02602A Fracture of unspecified part of body of left mandible, initial encounter for closed fracture: Secondary | ICD-10-CM | POA: Insufficient documentation

## 2017-02-21 DIAGNOSIS — Y9302 Activity, running: Secondary | ICD-10-CM | POA: Insufficient documentation

## 2017-02-21 DIAGNOSIS — Y929 Unspecified place or not applicable: Secondary | ICD-10-CM | POA: Insufficient documentation

## 2017-02-21 DIAGNOSIS — Y999 Unspecified external cause status: Secondary | ICD-10-CM | POA: Insufficient documentation

## 2017-02-21 MED ORDER — OXYCODONE-ACETAMINOPHEN 5-325 MG PO TABS
2.0000 | ORAL_TABLET | Freq: Once | ORAL | Status: AC
  Administered 2017-02-21: 2 via ORAL
  Filled 2017-02-21: qty 2

## 2017-02-21 MED ORDER — CHLORHEXIDINE GLUCONATE 0.12 % MT SOLN: 15.0000 mL | Freq: Four times a day (QID) | OROMUCOSAL | 0 refills | Status: AC

## 2017-02-21 MED ORDER — OXYCODONE-ACETAMINOPHEN 5-325 MG PO TABS: 1.0000 | ORAL_TABLET | ORAL | 0 refills | Status: DC | PRN

## 2017-02-21 MED ORDER — MORPHINE SULFATE (PF) 4 MG/ML IV SOLN
6.0000 mg | Freq: Once | INTRAVENOUS | Status: AC
  Administered 2017-02-21: 11:00:00 via INTRAVENOUS
  Filled 2017-02-21: qty 2

## 2017-02-21 MED ORDER — CHLORHEXIDINE GLUCONATE 0.12 % MT SOLN
5.0000 mL | Freq: Four times a day (QID) | OROMUCOSAL | Status: DC
  Filled 2017-02-21: qty 15
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
## 2017-02-21 NOTE — ED Triage Notes (Signed)
Pt complaint of left jaw pain post fall into stair rail this morning; swelling noted.
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
## 2017-02-28 ENCOUNTER — Other Ambulatory Visit (HOSPITAL_COMMUNITY): Payer: Self-pay | Admitting: Otolaryngology

## 2017-02-28 ENCOUNTER — Ambulatory Visit (HOSPITAL_COMMUNITY)
Admission: RE | Admit: 2017-02-28 | Discharge: 2017-02-28 | Disposition: A | Discharge status: Home / Self Care | Payer: Self-pay | Source: Ambulatory Visit | Attending: Otolaryngology | Admitting: Otolaryngology

## 2017-02-28 DIAGNOSIS — S02652D Fracture of angle of left mandible, subsequent encounter for fracture with routine healing: Secondary | ICD-10-CM | POA: Insufficient documentation

## 2017-02-28 DIAGNOSIS — X58XXXD Exposure to other specified factors, subsequent encounter: Secondary | ICD-10-CM | POA: Insufficient documentation

## 2017-02-28 DIAGNOSIS — S02609D Fracture of mandible, unspecified, subsequent encounter for fracture with routine healing: Secondary | ICD-10-CM | POA: Insufficient documentation

## 2021-01-09 ENCOUNTER — Ambulatory Visit (INDEPENDENT_AMBULATORY_CARE_PROVIDER_SITE_OTHER): Payer: Self-pay

## 2021-01-09 ENCOUNTER — Emergency Department (HOSPITAL_COMMUNITY): Payer: Self-pay

## 2021-01-09 ENCOUNTER — Other Ambulatory Visit: Payer: Self-pay

## 2021-01-09 ENCOUNTER — Encounter (HOSPITAL_COMMUNITY): Payer: Self-pay

## 2021-01-09 ENCOUNTER — Encounter (HOSPITAL_COMMUNITY): Payer: Self-pay | Admitting: Emergency Medicine

## 2021-01-09 ENCOUNTER — Observation Stay (HOSPITAL_COMMUNITY)
Admission: EM | Admit: 2021-01-09 | Discharge: 2021-01-10 | Disposition: A | Discharge status: Home / Self Care | Payer: Self-pay | Attending: Emergency Medicine | Admitting: Emergency Medicine

## 2021-01-09 ENCOUNTER — Ambulatory Visit (HOSPITAL_COMMUNITY)
Admission: EM | Admit: 2021-01-09 | Discharge: 2021-01-09 | Disposition: A | Discharge status: Other Acute Inpatient Hospital | Payer: Self-pay | Attending: Family Medicine | Admitting: Family Medicine

## 2021-01-09 DIAGNOSIS — W19XXXA Unspecified fall, initial encounter: Secondary | ICD-10-CM

## 2021-01-09 DIAGNOSIS — S270XXA Traumatic pneumothorax, initial encounter: Secondary | ICD-10-CM

## 2021-01-09 DIAGNOSIS — Z20822 Contact with and (suspected) exposure to covid-19: Secondary | ICD-10-CM | POA: Insufficient documentation

## 2021-01-09 DIAGNOSIS — W109XXA Fall (on) (from) unspecified stairs and steps, initial encounter: Secondary | ICD-10-CM | POA: Insufficient documentation

## 2021-01-09 DIAGNOSIS — J939 Pneumothorax, unspecified: Secondary | ICD-10-CM | POA: Diagnosis present

## 2021-01-09 DIAGNOSIS — S2242XA Multiple fractures of ribs, left side, initial encounter for closed fracture: Secondary | ICD-10-CM

## 2021-01-09 DIAGNOSIS — Z23 Encounter for immunization: Secondary | ICD-10-CM | POA: Insufficient documentation

## 2021-01-09 DIAGNOSIS — S2232XA Fracture of one rib, left side, initial encounter for closed fracture: Principal | ICD-10-CM | POA: Insufficient documentation

## 2021-01-09 DIAGNOSIS — R0781 Pleurodynia: Secondary | ICD-10-CM

## 2021-01-09 DIAGNOSIS — F17210 Nicotine dependence, cigarettes, uncomplicated: Secondary | ICD-10-CM | POA: Insufficient documentation

## 2021-01-09 LAB — CBC WITH DIFFERENTIAL/PLATELET
Abs Immature Granulocytes: 0.05 10*3/uL (ref 0.00–0.07)
Basophils Absolute: 0 10*3/uL (ref 0.0–0.1)
Basophils Relative: 0 %
Eosinophils Absolute: 0 10*3/uL (ref 0.0–0.5)
Eosinophils Relative: 0 %
HCT: 39.5 % (ref 39.0–52.0)
Hemoglobin: 13.1 g/dL (ref 13.0–17.0)
Immature Granulocytes: 1 %
Lymphocytes Relative: 13 %
Lymphs Abs: 1.3 10*3/uL (ref 0.7–4.0)
MCH: 31.6 pg (ref 26.0–34.0)
MCHC: 33.2 g/dL (ref 30.0–36.0)
MCV: 95.4 fL (ref 80.0–100.0)
Monocytes Absolute: 1.3 10*3/uL — ABNORMAL HIGH (ref 0.1–1.0)
Monocytes Relative: 13 %
Neutro Abs: 7.1 10*3/uL (ref 1.7–7.7)
Neutrophils Relative %: 73 %
Platelets: 287 10*3/uL (ref 150–400)
RBC: 4.14 MIL/uL — ABNORMAL LOW (ref 4.22–5.81)
RDW: 13.2 % (ref 11.5–15.5)
WBC: 9.8 10*3/uL (ref 4.0–10.5)
nRBC: 0 % (ref 0.0–0.2)

## 2021-01-09 LAB — BASIC METABOLIC PANEL
Anion gap: 8 (ref 5–15)
BUN: 9 mg/dL (ref 6–20)
CO2: 28 mmol/L (ref 22–32)
Calcium: 9.4 mg/dL (ref 8.9–10.3)
Chloride: 102 mmol/L (ref 98–111)
Creatinine, Ser: 0.89 mg/dL (ref 0.61–1.24)
GFR, Estimated: >60 mL/min (ref >60)
Glucose, Bld: 94 mg/dL (ref 70–99)
Potassium: 4.2 mmol/L (ref 3.5–5.1)
Sodium: 138 mmol/L (ref 135–145)

## 2021-01-09 LAB — RESP PANEL BY RT-PCR (FLU A&B, COVID) ARPGX2
Influenza A by PCR: NEGATIVE
Influenza B by PCR: NEGATIVE
SARS Coronavirus 2 by RT PCR: NEGATIVE

## 2021-01-09 MED ORDER — OXYCODONE HCL 5 MG PO TABS: 5.0000 mg | ORAL_TABLET | ORAL | Status: DC | PRN

## 2021-01-09 MED ORDER — LIDOCAINE 5 % EX PTCH
3.0000 | MEDICATED_PATCH | CUTANEOUS | Status: DC
  Administered 2021-01-09: 3 via TRANSDERMAL
  Filled 2021-01-09 (×2): qty 3

## 2021-01-09 MED ORDER — ONDANSETRON 4 MG PO TBDP: 4.0000 mg | ORAL_TABLET | Freq: Four times a day (QID) | ORAL | Status: DC | PRN

## 2021-01-09 MED ORDER — ACETAMINOPHEN 325 MG PO TABS
650.0000 mg | ORAL_TABLET | Freq: Four times a day (QID) | ORAL | Status: DC
  Administered 2021-01-09 – 2021-01-10 (×3): 650 mg via ORAL
  Filled 2021-01-09 (×4): qty 2

## 2021-01-09 MED ORDER — KETOROLAC TROMETHAMINE 30 MG/ML IJ SOLN
30.0000 mg | Freq: Once | INTRAMUSCULAR | Status: AC
  Administered 2021-01-09: 30 mg via INTRAVENOUS
  Filled 2021-01-09: qty 1

## 2021-01-09 MED ORDER — OXYCODONE HCL 5 MG PO TABS: 10.0000 mg | ORAL_TABLET | ORAL | Status: DC | PRN

## 2021-01-09 MED ORDER — PNEUMOCOCCAL VAC POLYVALENT 25 MCG/0.5ML IJ INJ
0.5000 mL | INJECTION | INTRAMUSCULAR | Status: AC
  Administered 2021-01-10: 0.5 mL via INTRAMUSCULAR
  Filled 2021-01-09: qty 0.5

## 2021-01-09 MED ORDER — OXYCODONE-ACETAMINOPHEN 5-325 MG PO TABS
1.0000 | ORAL_TABLET | Freq: Once | ORAL | Status: AC
  Administered 2021-01-09: 1 via ORAL
  Filled 2021-01-09: qty 1

## 2021-01-09 MED ORDER — IOHEXOL 300 MG/ML  SOLN
75.0000 mL | Freq: Once | INTRAMUSCULAR | Status: AC | PRN
  Administered 2021-01-09: 75 mL via INTRAVENOUS

## 2021-01-09 MED ORDER — ONDANSETRON HCL 4 MG/2ML IJ SOLN: 4.0000 mg | Freq: Four times a day (QID) | INTRAMUSCULAR | Status: DC | PRN

## 2021-01-09 MED ORDER — KETOROLAC TROMETHAMINE 15 MG/ML IJ SOLN
15.0000 mg | Freq: Three times a day (TID) | INTRAMUSCULAR | Status: DC
  Administered 2021-01-10: 15 mg via INTRAVENOUS
  Filled 2021-01-09: qty 1

## 2021-01-09 MED ORDER — FENTANYL CITRATE (PF) 100 MCG/2ML IJ SOLN
50.0000 ug | Freq: Once | INTRAMUSCULAR | Status: AC
  Administered 2021-01-09: 50 ug via INTRAVENOUS
  Filled 2021-01-09: qty 2

## 2021-01-09 MED ORDER — ENOXAPARIN SODIUM 30 MG/0.3ML IJ SOSY
30.0000 mg | PREFILLED_SYRINGE | Freq: Two times a day (BID) | INTRAMUSCULAR | Status: DC
  Administered 2021-01-10: 30 mg via SUBCUTANEOUS
  Filled 2021-01-09: qty 0.3

## 2021-01-09 NOTE — ED Provider Notes (Signed)
Emergency Medicine Provider Triage Evaluation Note  Ricky Curtis , a 45 y.o. male  was evaluated in triage.  Pt complains of chest pain and shortness of breath after fall yesterday.  Patient states he was walking up the stairs yesterday when he tripped and fell, landing on his left side.  Since then, he has had left side pain which worsens with inspiration.  He has associated mild shortness of breath.  Today his symptoms were not improved, was admitted to urgent care.  He had an x-ray there which showed multiple rib fractures as well as a small pneumothorax.  He denies hitting his head or loss consciousness.  No headache, neck pain, back pain, nausea, vomit, abdominal pain.  He has no other medical problems.  He is not on blood thinners.  He has had ibuprofen this morning, has not taken anything else for pain.  Review of Systems  Positive: L sided cp and sob Negative: abd pain, neck and back pain  Physical Exam  There were no vitals taken for this visit. Gen:   Awake, no distress  Resp:  Normal effort. celar lung sounds. Inspiration limited by pain MSK:   Moves extremities without difficulty.  No TTP of back or midline spine Other:  No ttp of abd  Medical Decision Making  Medically screening exam initiated at 5:27 PM.  Appropriate orders placed.  Gena Fray was informed that the remainder of the evaluation will be completed by another provider, this initial triage assessment does not replace that evaluation, and the importance of remaining in the ED until their evaluation is complete.  Lower basic labs.  X-ray obtained in urgent care and available in our system.     Alveria Apley, PA-C 01/09/21 1728    Blane Ohara, MD 01/10/21 [phone]

## 2021-01-09 NOTE — ED Triage Notes (Signed)
Pt was diagnosed at urgent care with a left sided pneumothorax and 3 broken ribs after falling on concrete stairs yesterday. Pt reports 10/10 pain.

## 2021-01-09 NOTE — ED Triage Notes (Signed)
Pt reports left rib cage pain x 1 day.States he fell on concrete stairs yesterday. Pt states he knows he broke the ribs as he can feel moving around. Pain is worse when moving. Ibuprofen gives no relief.
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
## 2021-01-09 NOTE — ED Notes (Signed)
Patient transported to CT 
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
## 2021-01-09 NOTE — ED Notes (Signed)
Patient returned from imaging.

## 2021-01-09 NOTE — ED Notes (Signed)
Spoke to CT pt to room 1

## 2021-01-10 ENCOUNTER — Observation Stay (HOSPITAL_COMMUNITY): Payer: Self-pay

## 2021-01-10 LAB — HIV ANTIBODY (ROUTINE TESTING W REFLEX): HIV Screen 4th Generation wRfx: NONREACTIVE

## 2021-01-10 MED ORDER — IBUPROFEN 200 MG PO TABS: 400.0000 mg | ORAL_TABLET | Freq: Three times a day (TID) | ORAL | Status: AC | PRN

## 2021-01-10 MED ORDER — OXYCODONE HCL 5 MG PO TABS: 5.0000 mg | ORAL_TABLET | Freq: Four times a day (QID) | ORAL | 0 refills | Status: AC | PRN

## 2021-01-10 MED ORDER — ACETAMINOPHEN 325 MG PO TABS: 650.0000 mg | ORAL_TABLET | Freq: Four times a day (QID) | ORAL | Status: AC

## 2021-01-10 MED ORDER — OXYCODONE HCL 5 MG PO TABS
5.0000 mg | ORAL_TABLET | ORAL | Status: DC | PRN
  Administered 2021-01-10: 5 mg via ORAL
  Filled 2021-01-10: qty 1

## 2021-01-10 NOTE — Evaluation (Signed)
Physical Therapy Evaluation Patient Details Name: Ricky Curtis MRN: [id number] DOB: May 31, 1976 Today's Date: 01/10/2021   History of Present Illness  Ricky Curtis is a 45 y.o. male who presents with complaint of left sided, non-radiating chest pain s/p fall yesterday on concrete stairs. Pain worse with inspiration and movement. No specific SOB reported.  Patient found to have L rib fractures and small pneumothorax.    Clinical Impression  Patient received in bed, agreeable to PT session. Patient is independent with bed mobility, transfers and ambulation of 200 feet in hallway. He has no reported or observed difficulties and requires no PT follow up at this time. Will sign off.     Follow Up Recommendations No PT follow up    Equipment Recommendations  None recommended by PT    Recommendations for Other Services       Precautions / Restrictions Restrictions Weight Bearing Restrictions: No      Mobility  Bed Mobility Overal bed mobility: Independent                  Transfers Overall transfer level: Independent                  Ambulation/Gait Ambulation/Gait assistance: Independent Gait Distance (Feet): 200 Feet Assistive device: None Gait Pattern/deviations: WFL(Within Functional Limits) Gait velocity: normal Gait velocity interpretation: >4.37 ft/sec, indicative of normal walking speed General Gait Details: no difficulties with ambulation, no sob  Stairs            Wheelchair Mobility    Modified Rankin (Stroke Patients Only)       Balance Overall balance assessment: Independent                                           Pertinent Vitals/Pain Pain Assessment: Faces Faces Pain Scale: Hurts a little bit Pain Location: L chest Pain Descriptors / Indicators: Discomfort Pain Intervention(s): Premedicated before session    Home Living Family/patient expects to be discharged to:: Private residence Living  Arrangements: Other relatives Available Help at Discharge: Family;Available PRN/intermittently                  Prior Function Level of Independence: Independent         Comments: works, fully independent     Higher education careers adviser        Extremity/Trunk Assessment   Upper Extremity Assessment Upper Extremity Assessment: Overall WFL for tasks assessed    Lower Extremity Assessment Lower Extremity Assessment: Overall WFL for tasks assessed    Cervical / Trunk Assessment Cervical / Trunk Assessment: Normal  Communication   Communication: No difficulties  Cognition Arousal/Alertness: Awake/alert Behavior During Therapy: WFL for tasks assessed/performed Overall Cognitive Status: Within Functional Limits for tasks assessed                                        General Comments      Exercises     Assessment/Plan    PT Assessment Patent does not need any further PT services  PT Problem List         PT Treatment Interventions      PT Goals (Current goals can be found in the Care Plan section)  Acute Rehab PT Goals Patient Stated Goal: to return home, back to  work PT Goal Formulation: With patient Time For Goal Achievement: 01/11/21 Potential to Achieve Goals: Good    Frequency     Barriers to discharge        Co-evaluation               AM-PAC PT "6 Clicks" Mobility  Outcome Measure Help needed turning from your back to your side while in a flat bed without using bedrails?: None Help needed moving from lying on your back to sitting on the side of a flat bed without using bedrails?: None Help needed moving to and from a bed to a chair (including a wheelchair)?: None Help needed standing up from a chair using your arms (e.g., wheelchair or bedside chair)?: None Help needed to walk in hospital room?: None Help needed climbing 3-5 steps with a railing? : None 6 Click Score: 24    End of Session   Activity Tolerance: Patient  tolerated treatment well Patient left: in bed;with call bell/phone within reach Nurse Communication: Mobility status      Time: 0092-3300 PT Time Calculation (min) (ACUTE ONLY): 10 min   Charges:   PT Evaluation $PT Eval Low Complexity: 1 Low          Leopold Smyers, PT, GCS 01/10/21,9:57 AM

## 2021-01-10 NOTE — TOC CAGE-AID Note (Signed)
Transition of Care [facility]) - CAGE-AID Screening   Patient Details  Name: Ricky Curtis MRN: [id number] Date of Birth: 1976-05-16   Hewitt Shorts, RN Trauma Response Nurse Phone Number:[phone] 01/10/2021, 10:45 AM   Clinical Narrative:    CAGE-AID Screening:    Have You Ever Felt You Ought to Cut Down on Your Drinking or Drug Use?: No Have People Annoyed You By Critizing Your Drinking Or Drug Use?: No Have You Felt Bad Or Guilty About Your Drinking Or Drug Use?: No Have You Ever Had a Drink or Used Drugs First Thing In The Morning to Steady Your Nerves or to Get Rid of a Hangover?: No CAGE-AID Score: 0  Substance Abuse Education Offered: No

## 2021-01-10 NOTE — Progress Notes (Signed)
Discharge summary packet provided to pt with instructions. Pt verbalized understanding of instructions. All questions and concerns were satisfactorily answered. No complaints. Pt D/C to home as ordered. Pt is responsible for his own ride back home.

## 2021-01-10 NOTE — Discharge Instructions (Signed)
Rib Fracture  A rib fracture is a break or crack in one of the bones of the ribs. The ribs are like a cage that goes around your upper chest. A broken or cracked rib is often painful, but most do not cause other problems. Most rib fractures usually heal on their own in 1-3 months. What are the causes?  Doing movements over and over again with a lot of force, such as pitching a baseball or having a very bad cough.  A direct hit to the chest.  Cancer that has spread to the bones. What are the signs or symptoms?  Pain when you breathe in or cough.  Pain when someone presses on the injured area.  Feeling short of breath. How is this treated? Treatment depends on how bad the fracture is. In general:  Most rib fractures usually heal on their own in 1-3 months.  Healing may take longer if you have a cough or are doing activities that make the injury worse.  While you heal, you may be given medicines to control pain.  You will also be taught deep breathing exercises.  Very bad injuries may require a stay at the hospital or surgery. Follow these instructions at home: Managing pain, stiffness, and swelling  If told, put ice on the injured area. To do this: ? Put ice in a plastic bag. ? Place a towel between your skin and the bag. ? Leave the ice on for 20 minutes, 2-3 times a day. ? Take off the ice if your skin turns bright red. This is very important. If you cannot feel pain, heat, or cold, you have a greater risk of damage to the area.  Take over-the-counter and prescription medicines only as told by your doctor. Activity  Avoid activities that cause pain to the injured area. Protect your injured area.  Slowly increase activity as told by your doctor. General instructions  Do deep breathing exercises as told by your doctor. You may be told to: ? Take deep breaths many times a day. ? Cough several times a day while hugging a pillow. ? Use a device (incentive spirometer) to do  deep breathing many times a day.  Drink enough fluid to keep your pee (urine) clear or pale yellow.  Do not wear a rib belt or binder.  Keep all follow-up visits. Contact a doctor if:  You have a fever. Get help right away if:  You have trouble breathing.  You are short of breath.  You cannot stop coughing.  You cough up thick or bloody spit.  You feel like you may vomit (nauseous), vomit, or have belly (abdominal) pain.  Your pain gets worse and medicine does not help. These symptoms may be an emergency. Get help right away. Call your local emergency services (911 in the [place]  Do not wait to see if the symptoms will go away.  Do not drive yourself to the hospital. Summary  A rib fracture is a break or crack in one of the bones of the ribs.  Apply ice to the injured area and take medicines for pain as told by your doctor.  Take deep breaths and cough several times a day. Hug a pillow every time you cough. This information is not intended to replace advice given to you by your health care provider. Make sure you discuss any questions you have with your health care provider. Document Revised: 12/03/2019 Document Reviewed: 12/03/2019 Elsevier Patient Education  2021 [employer].  Pneumothorax A pneumothorax is commonly called a collapsed lung. It is a condition in which air leaks from a lung and builds up between the thin layer of tissue that covers the lungs (visceral pleura) and the interior wall of the chest cavity (parietal pleura). The air gets trapped outside the lung, between the lung and the chest wall (pleural space). The air takes up space and prevents the lung from fully expanding. This condition sometimes occurs suddenly with no apparent cause. The buildup of air may be small or large. A small pneumothorax may go away on its own. A large pneumothorax will require treatment and hospitalization. What are the causes? This condition may be caused by:  Trauma  and injury to the chest wall.  Surgery and other medical procedures.  A complication of an underlying lung problem, especially chronic obstructive pulmonary disease (COPD) or emphysema. Sometimes the cause of this condition is not known. What increases the risk? You are more likely to develop this condition if:  You have an underlying lung problem.  You smoke.  You are 77-44 years old, male, tall, and underweight.  You have a personal or family history of pneumothorax.  You have an eating disorder (anorexia nervosa). This condition can also happen quickly, even in people with no history of lung problems. What are the signs or symptoms? Sometimes a pneumothorax will have no symptoms. When symptoms are present, they can include:  Chest pain.  Shortness of breath.  Increased rate of breathing.  Bluish color to your lips or skin (cyanosis). How is this diagnosed? This condition may be diagnosed by:  A medical history and physical exam.  A chest X-ray, chest CT scan, or ultrasound. How is this treated? Treatment depends on how severe your condition is. The goal of treatment is to remove the extra air and allow your lung to expand back to its normal size.  For a small pneumothorax: ? No treatment may be needed. ? Extra oxygen is sometimes used to make it go away more quickly.  For a large pneumothorax or one that is causing symptoms, a procedure is done to release the air from around your lungs. To do this, a health care provider may use: ? A needle with a syringe. This is used to suck air from a pleural space where no additional leakage is taking place. ? A chest tube. This is used to suck air where there is ongoing leakage into the pleural space. The chest tube may need to remain in place for several days until the air leak has healed.  In more severe cases, surgery may be needed to repair the damage that is causing the leak.  If you have multiple pneumothorax episodes or  have an air leak that will not heal, a procedure called a pleurodesis may be done. A medicine is placed in the pleural space to irritate the tissues around the lung so that the lung will stick to the chest wall, seal any leaks, and stop any buildup of air in that space. If you have an underlying lung problem, severe symptoms, or a large pneumothorax you will usually need to stay in the hospital.   Follow these instructions at home: Lifestyle  Do not use any products that contain nicotine or tobacco, such as cigarettes and e-cigarettes. These are major risk factors in pneumothorax. If you need help quitting, ask your health care provider.  Do not lift anything that is heavier than 10 lb (4.5 kg), or the limit that  your health care provider tells you, until he or she says that it is safe.  Avoid activities that take a lot of effort (strenuous) for as long as told by your health care provider.  Return to your normal activities as told by your health care provider. Ask your health care provider what activities are safe for you.  Do not fly in an airplane or scuba dive until your health care provider says it is okay. General instructions  Take over-the-counter and prescription medicines only as told by your health care provider.  If a cough or pain makes it difficult for you to sleep at night, try sleeping in a semi-upright position in a recliner or by using 2 or 3 pillows.  If you had a chest tube and it was removed, ask your health care provider when you can remove the bandage (dressing). While the dressing is in place, do not allow it to get wet.  Keep all follow-up visits as told by your health care provider. This is important. Contact a health care provider if:  You cough up thick mucus (sputum) that is yellow or green in color.  You were treated with a chest tube, and you have redness, increasing pain, or discharge at the site where it was placed. Get help right away if:  You have  increasing chest pain or shortness of breath.  You have a cough that will not go away.  You begin coughing up blood.  You have pain that is getting worse or is not controlled with medicines.  The site where your chest tube was located opens up.  You feel air coming out of the site where the chest tube was placed.  You have a fever or persistent symptoms for more than 2-3 days. These symptoms may represent a serious problem that is an emergency. Do not wait to see if the symptoms will go away. Get medical help right away. Call your local emergency services (911 in the [place] Do not drive yourself to the hospital. Summary  A pneumothorax, commonly called a collapsed lung, is a condition in which air leaks from a lung and gets trapped between the lung and the chest wall (pleural space).  The buildup of air may be small or large. A small pneumothorax may go away on its own. A large pneumothorax will require treatment and hospitalization.  Treatment for this condition depends on how severe the pneumothorax is. The goal of treatment is to remove the extra air and allow the lung to expand back to its normal size. This information is not intended to replace advice given to you by your health care provider. Make sure you discuss any questions you have with your health care provider. Document Revised: 04/13/2020 Document Reviewed: 04/13/2020 Elsevier Patient Education  2021 [employer].

## 2021-01-10 NOTE — Discharge Summary (Signed)
[facility] Discharge Summary   Patient ID: Ricky Curtis MRN: [id number] DOB/AGE: [birth year] 45 y.o.  Admit date: 01/09/2021 Discharge date: 01/10/2021   Discharge Diagnosis Fall down steps Left rib fractures  Consultants None   Imaging: DG Ribs Unilateral W/Chest Left  Result Date: 01/09/2021 CLINICAL DATA:  Trauma, pain after fall EXAM: LEFT RIBS AND CHEST - 3+ VIEW COMPARISON:  07/06/2016 FINDINGS: Single-view chest demonstrates no focal opacity or pleural effusion. Normal cardiac size. Small left apical pneumothorax estimated at less than 10%. Air within the left chest wall soft tissues. Left rib series demonstrates acute left fifth, sixth and seventh lateral rib fractures. IMPRESSION: 1. Acute left fifth through seventh rib fractures with small left apical pneumothorax and mild gas within the left chest wall. Critical Value/emergent results were called by telephone at the time of interpretation on 01/09/2021 at 4:40 pm to provider [facility] , who verbally acknowledged these results. Electronically Signed   By: Jasmine Pang M.D.   On: 01/09/2021 16:40   CT Head Wo Contrast  Result Date: 01/09/2021 CLINICAL DATA:  Status post trauma. EXAM: CT HEAD WITHOUT CONTRAST TECHNIQUE: Contiguous axial images were obtained from the base of the skull through the vertex without intravenous contrast. COMPARISON:  June 26, 2011 FINDINGS: Brain: No evidence of acute infarction, hemorrhage, hydrocephalus, extra-axial collection or mass lesion/mass effect. Vascular: No hyperdense vessel or unexpected calcification. Skull: A chronic left-sided nasal bone fracture is seen. Sinuses/Orbits: No acute finding. Other: A small, stable left frontal soft tissue calcification is seen. IMPRESSION: No acute intracranial abnormality. Electronically Signed   By: Aram Candela M.D.   On: 01/09/2021 21:54   CT Chest W Contrast  Result Date: 01/09/2021 CLINICAL DATA:  Status post fall. EXAM: CT  CHEST, ABDOMEN, AND PELVIS WITH CONTRAST TECHNIQUE: Multidetector CT imaging of the chest, abdomen and pelvis was performed following the standard protocol during bolus administration of intravenous contrast. CONTRAST:  22mL OMNIPAQUE IOHEXOL 300 MG/ML  SOLN COMPARISON:  March 17, 2012 FINDINGS: CT CHEST FINDINGS Cardiovascular: No significant vascular findings. Normal heart size. No pericardial effusion. Mediastinum/Nodes: No enlarged mediastinal, hilar, or axillary lymph nodes. Thyroid gland, trachea, and esophagus demonstrate no significant findings. Lungs/Pleura: Mild atelectasis is seen within the posterior and lateral aspect of the left lung base. There is no evidence of a pleural effusion. A small anterior left-sided pneumothorax is seen. This measures approximately 1.1 cm in maximum thickness and extends from the left apex to the left lung base. Musculoskeletal: Acute sixth and seventh lateral left rib fractures are seen. CT ABDOMEN PELVIS FINDINGS Hepatobiliary: No focal liver abnormality is seen. No gallstones, gallbladder wall thickening, or biliary dilatation. Pancreas: Unremarkable. No pancreatic ductal dilatation or surrounding inflammatory changes. Spleen: Normal in size without focal abnormality. Adrenals/Urinary Tract: Adrenal glands are unremarkable. Kidneys are normal, without renal calculi, focal lesion, or hydronephrosis. Bladder is unremarkable. Stomach/Bowel: Stomach is within normal limits. The appendix is poorly visualized. No evidence of bowel wall thickening, distention, or inflammatory changes. Vascular/Lymphatic: No significant vascular findings are present. No enlarged abdominal or pelvic lymph nodes. Reproductive: Prostate is unremarkable. Other: No abdominal wall hernia or abnormality. No abdominopelvic ascites. Musculoskeletal: No acute or significant osseous findings. IMPRESSION: 1. Small anterior left-sided pneumothorax. 2. Mild left basilar atelectasis. A small amount of pulmonary  contusion cannot be excluded. 3. Acute sixth and seventh lateral left rib fractures. 4. No evidence of acute or active process within the abdomen or pelvis. Electronically Signed   By: Aram Candela  M.D.   On: 01/09/2021 22:07   CT Cervical Spine Wo Contrast  Result Date: 01/09/2021 CLINICAL DATA:  Status post fall. EXAM: CT CERVICAL SPINE WITHOUT CONTRAST TECHNIQUE: Multidetector CT imaging of the cervical spine was performed without intravenous contrast. Multiplanar CT image reconstructions were also generated. COMPARISON:  None. FINDINGS: Alignment: Normal. Skull base and vertebrae: No acute fracture. No primary bone lesion or focal pathologic process. Soft tissues and spinal canal: No prevertebral fluid or swelling. No visible canal hematoma. Disc levels: Moderate severity anterior and lateral osteophyte formation is seen at the levels of C2-C3, C3-C4, C4-C5, and C5-C6. Mild osteophyte formation is noted at the level of C6-C7. Very mild multilevel intervertebral disc space narrowing is seen. Mild bilateral multilevel facet joint hypertrophy is noted. Upper chest: Negative. Other: None. IMPRESSION: 1. No evidence of acute cervical spine fracture or subluxation. 2. Moderate severity multilevel degenerative changes, as described above. Electronically Signed   By: Aram Candela M.D.   On: 01/09/2021 22:10   CT ABDOMEN PELVIS W CONTRAST  Result Date: 01/09/2021 CLINICAL DATA:  Status post fall. EXAM: CT CHEST, ABDOMEN, AND PELVIS WITH CONTRAST TECHNIQUE: Multidetector CT imaging of the chest, abdomen and pelvis was performed following the standard protocol during bolus administration of intravenous contrast. CONTRAST:  86mL OMNIPAQUE IOHEXOL 300 MG/ML  SOLN COMPARISON:  None. FINDINGS: CT CHEST FINDINGS Cardiovascular: No significant vascular findings. Normal heart size. No pericardial effusion. Mediastinum/Nodes: No enlarged mediastinal, hilar, or axillary lymph nodes. Thyroid gland, trachea, and  esophagus demonstrate no significant findings. Lungs/Pleura: Mild atelectasis is seen within the posterior and lateral aspect of the left lung base. There is no evidence of a pleural effusion. A small anterior left-sided pneumothorax is seen. This measures approximately 1.1 cm in maximum thickness and extends from the left apex to the left lung base. Musculoskeletal: Acute sixth and seventh lateral left rib fractures are seen. CT ABDOMEN PELVIS FINDINGS Hepatobiliary: No focal liver abnormality is seen. No gallstones, gallbladder wall thickening, or biliary dilatation. Pancreas: Unremarkable. No pancreatic ductal dilatation or surrounding inflammatory changes. Spleen: Normal in size without focal abnormality. Adrenals/Urinary Tract: Adrenal glands are unremarkable. Kidneys are normal, without renal calculi, focal lesion, or hydronephrosis. Bladder is unremarkable. Stomach/Bowel: Stomach is within normal limits. The appendix is poorly visualized. No evidence of bowel wall thickening, distention, or inflammatory changes. Vascular/Lymphatic: No significant vascular findings are present. No enlarged abdominal or pelvic lymph nodes. Reproductive: Prostate is unremarkable. Other: No abdominal wall hernia or abnormality. No abdominopelvic ascites. Musculoskeletal: No acute or significant osseous findings. IMPRESSION: 1. Small anterior left-sided pneumothorax. 2. Mild left basilar atelectasis. A small amount of pulmonary contusion cannot be excluded. 3. Acute sixth and seventh lateral left rib fractures. 4. No evidence of acute or active process within the abdomen or pelvis. Electronically Signed   By: Aram Candela M.D.   On: 01/09/2021 22:08   DG Chest Port 1 View  Result Date: 01/10/2021 CLINICAL DATA:  Left pneumothorax EXAM: PORTABLE CHEST 1 VIEW COMPARISON:  01/09/2021 FINDINGS: Lungs are clear. Previously noted left pneumothorax has resolved. No pneumothorax is present. No pleural effusion. Cardiac size  within normal limits. Pulmonary vascularity is normal. Acute left sixth rib fracture is seen laterally. Mild associated subcutaneous gas within this region is again noted. IMPRESSION: Resolved left apical pneumothorax. Electronically Signed   By: Helyn Numbers MD   On: 01/10/2021 06:50    Procedures None   Hospital Course:  Ricky Curtis is an 45 y.o. male who presented as  a trauma consult 5/17 after falling down the stairs 5/16.  He was walking down the stairs when he stepped on an object and lost his footing.  He fell on his left chest on the concrete/metal stairs. Workup significant for left rib fractures 6-7 and a tiny left pneumothorax. Patient admitted for observation and pain control. On 5/18 repeat chest x-ray was without visible PTX. On 5/18 vitals were stable, pain controlled, tolerating PO, mobilizing independently and felt stable for discharge home. Patient will follow up as below and knows to call with questions/concerns. Work note provided.  I have personally reviewed the patients medication history on the [place] controlled substance database.  Physical Exam: General:  Alert, NAD, pleasant, comfortable CV: RRR no m/r/g, no peripheral edema Pulm: appropriately tender left lateral chest wall tenderness, CTAB Abd:  Soft, ND, Nontender Psych: A&Ox3  Allergies as of 01/10/2021   No Known Allergies     Medication List    TAKE these medications   acetaminophen 325 MG tablet Commonly known as: TYLENOL Take 2-3 tablets (650-975 mg total) by mouth every 6 (six) hours.   ibuprofen 200 MG tablet Commonly known as: ADVIL Take 2-3 tablets (400-600 mg total) by mouth every 8 (eight) hours as needed for mild pain or moderate pain.   multivitamin tablet Take 1 tablet by mouth daily.   oxyCODONE 5 MG immediate release tablet Commonly known as: Oxy IR/ROXICODONE Take 1 tablet (5 mg total) by mouth every 6 (six) hours as needed for moderate pain or severe pain (not relieved by tylenol  or ibuprofen.).         Follow-up Information    [facility] Follow up.   Why: call as needed. Contact information: [address] [place] [address] [phone]       [facility] Follow up.   Why: Call for PCP appointment  Contact information: [address] [place] [address] [phone]              Signed: Hosie Spangle, [facility] 01/10/2021, 11:42 AM

## 2021-02-09 ENCOUNTER — Emergency Department (HOSPITAL_COMMUNITY)
Admission: EM | Admit: 2021-02-09 | Discharge: 2021-02-09 | Disposition: A | Discharge status: Home / Self Care | Payer: Self-pay | Attending: Emergency Medicine | Admitting: Emergency Medicine

## 2021-02-09 ENCOUNTER — Emergency Department (HOSPITAL_COMMUNITY): Payer: Self-pay

## 2021-02-09 DIAGNOSIS — F19922 Other psychoactive substance use, unspecified with intoxication with perceptual disturbance: Secondary | ICD-10-CM

## 2021-02-09 DIAGNOSIS — F1910 Other psychoactive substance abuse, uncomplicated: Secondary | ICD-10-CM

## 2021-02-09 DIAGNOSIS — F19122 Other psychoactive substance abuse with intoxication with perceptual disturbances: Secondary | ICD-10-CM | POA: Insufficient documentation

## 2021-02-09 DIAGNOSIS — Y908 Blood alcohol level of 240 mg/100 ml or more: Secondary | ICD-10-CM | POA: Insufficient documentation

## 2021-02-09 DIAGNOSIS — F17210 Nicotine dependence, cigarettes, uncomplicated: Secondary | ICD-10-CM | POA: Insufficient documentation

## 2021-02-09 LAB — URINALYSIS, ROUTINE W REFLEX MICROSCOPIC
Bacteria, UA: NONE SEEN
Bilirubin Urine: NEGATIVE
Glucose, UA: NEGATIVE mg/dL
Ketones, ur: NEGATIVE mg/dL
Leukocytes,Ua: NEGATIVE
Nitrite: NEGATIVE
Protein, ur: NEGATIVE mg/dL
Specific Gravity, Urine: 1.006 (ref 1.005–1.030)
pH: 5 (ref 5.0–8.0)

## 2021-02-09 LAB — CBC WITH DIFFERENTIAL/PLATELET
Abs Immature Granulocytes: 0.02 10*3/uL (ref 0.00–0.07)
Basophils Absolute: 0 10*3/uL (ref 0.0–0.1)
Basophils Relative: 0 %
Eosinophils Absolute: 0.1 10*3/uL (ref 0.0–0.5)
Eosinophils Relative: 1 %
HCT: 37.6 % — ABNORMAL LOW (ref 39.0–52.0)
Hemoglobin: 12.7 g/dL — ABNORMAL LOW (ref 13.0–17.0)
Immature Granulocytes: 0 %
Lymphocytes Relative: 25 %
Lymphs Abs: 2.1 10*3/uL (ref 0.7–4.0)
MCH: 32.2 pg (ref 26.0–34.0)
MCHC: 33.8 g/dL (ref 30.0–36.0)
MCV: 95.4 fL (ref 80.0–100.0)
Monocytes Absolute: 0.6 10*3/uL (ref 0.1–1.0)
Monocytes Relative: 7 %
Neutro Abs: 5.5 10*3/uL (ref 1.7–7.7)
Neutrophils Relative %: 67 %
Platelets: 247 10*3/uL (ref 150–400)
RBC: 3.94 MIL/uL — ABNORMAL LOW (ref 4.22–5.81)
RDW: 13 % (ref 11.5–15.5)
WBC: 8.2 10*3/uL (ref 4.0–10.5)
nRBC: 0 % (ref 0.0–0.2)

## 2021-02-09 LAB — COMPREHENSIVE METABOLIC PANEL
ALT: 16 U/L (ref 0–44)
AST: 27 U/L (ref 15–41)
Albumin: 3.7 g/dL (ref 3.5–5.0)
Alkaline Phosphatase: 53 U/L (ref 38–126)
Anion gap: 9 (ref 5–15)
BUN: 11 mg/dL (ref 6–20)
CO2: 25 mmol/L (ref 22–32)
Calcium: 8.3 mg/dL — ABNORMAL LOW (ref 8.9–10.3)
Chloride: 107 mmol/L (ref 98–111)
Creatinine, Ser: 1 mg/dL (ref 0.61–1.24)
GFR, Estimated: >60 mL/min (ref >60)
Glucose, Bld: 84 mg/dL (ref 70–99)
Potassium: 3.9 mmol/L (ref 3.5–5.1)
Sodium: 141 mmol/L (ref 135–145)
Total Bilirubin: 0.7 mg/dL (ref 0.3–1.2)
Total Protein: 6.6 g/dL (ref 6.5–8.1)

## 2021-02-09 LAB — RAPID URINE DRUG SCREEN, HOSP PERFORMED
Amphetamines: NOT DETECTED
Barbiturates: NOT DETECTED
Benzodiazepines: NOT DETECTED
Cocaine: POSITIVE — AB
Opiates: NOT DETECTED
Tetrahydrocannabinol: NOT DETECTED

## 2021-02-09 LAB — I-STAT CHEM 8, ED
BUN: 12 mg/dL (ref 6–20)
Calcium, Ion: 1.07 mmol/L — ABNORMAL LOW (ref 1.15–1.40)
Chloride: 106 mmol/L (ref 98–111)
Creatinine, Ser: 1.3 mg/dL — ABNORMAL HIGH (ref 0.61–1.24)
Glucose, Bld: 82 mg/dL (ref 70–99)
HCT: 38 % — ABNORMAL LOW (ref 39.0–52.0)
Hemoglobin: 12.9 g/dL — ABNORMAL LOW (ref 13.0–17.0)
Potassium: 3.6 mmol/L (ref 3.5–5.1)
Sodium: 144 mmol/L (ref 135–145)
TCO2: 27 mmol/L (ref 22–32)

## 2021-02-09 LAB — ETHANOL: Alcohol, Ethyl (B): 251 mg/dL — ABNORMAL HIGH (ref <10)

## 2021-02-09 LAB — MAGNESIUM: Magnesium: 2.2 mg/dL (ref 1.7–2.4)

## 2021-02-09 NOTE — ED Notes (Signed)
Pt in CT, I will obtain blood when pt returns

## 2021-02-09 NOTE — ED Notes (Signed)
Pt ate a sandwich, refuses to drink any liquids, no distress

## 2021-02-09 NOTE — ED Notes (Signed)
Pt opens eyes spontaneously, states he is very tired. Smells of ETOH, falls back to sleep quickly, denies pain, a/o, though states he doesn't remember last night

## 2021-02-09 NOTE — ED Notes (Signed)
Just returned from x-ray dept

## 2021-02-09 NOTE — ED Notes (Signed)
Pt not in room.  EDT states she saw pt leaving with no shirt on., pt apparently pulled out saline lock as it was found in the room with cannula intact
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
## 2021-02-09 NOTE — ED Triage Notes (Signed)
See chief complaint 

## 2021-10-25 IMAGING — CT CT CHEST W/ CM
2 of 5 series · 13 of 36 positions shown, 16 images · IV contrast (omnipaque)
Comparison: March 17, 2012

CLINICAL DATA: Status post fall.

EXAM:
CT CHEST, ABDOMEN, AND PELVIS WITH CONTRAST
TECHNIQUE: Multidetector CT imaging of the chest, abdomen and pelvis was
performed following the standard protocol during bolus
administration of intravenous contrast.
CONTRAST:  75mL OMNIPAQUE IOHEXOL 300 MG/ML  SOLN

[Series 3: cap with · axial · 0.63mm/px · z∈[-425,+95]mm · 10 of 128 slices shown, 13 images]
[im 12/128  mediastinal]
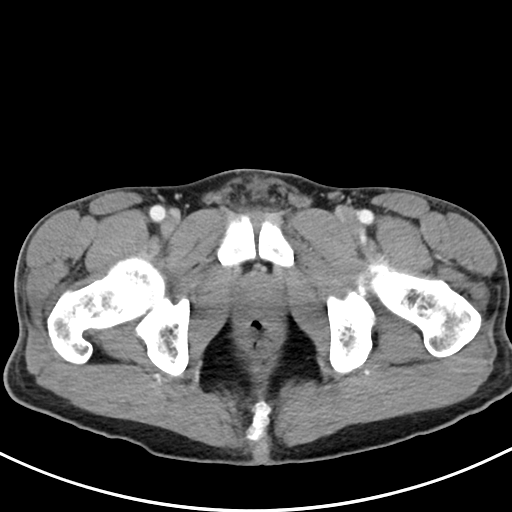
[im 12/128  lung]
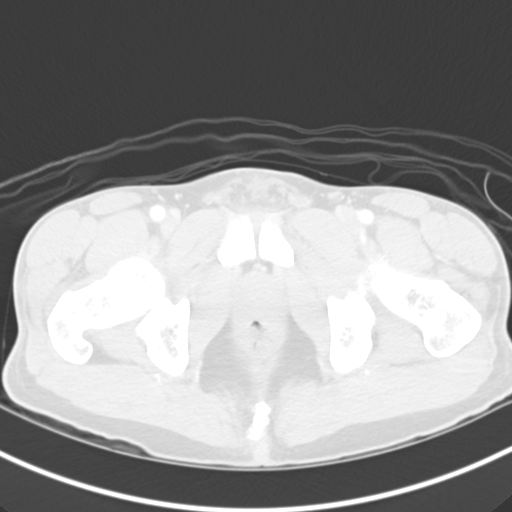
[im 24/128  lung]
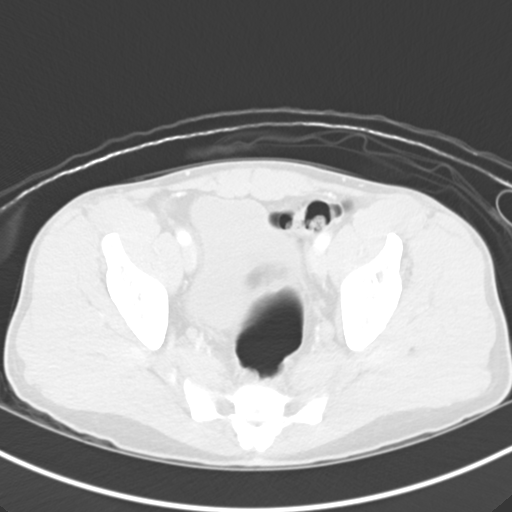
[im 35/128  lung]
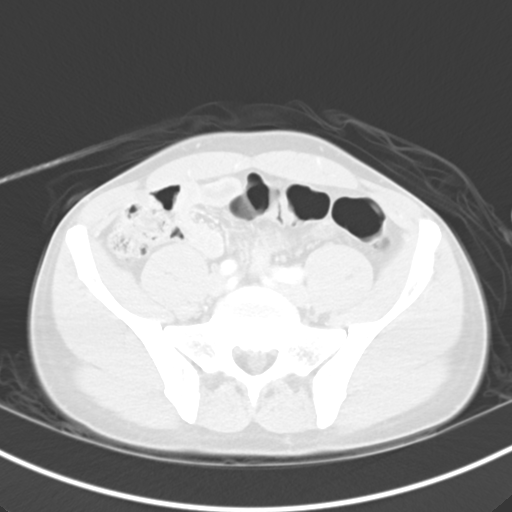
[im 47/128  lung]
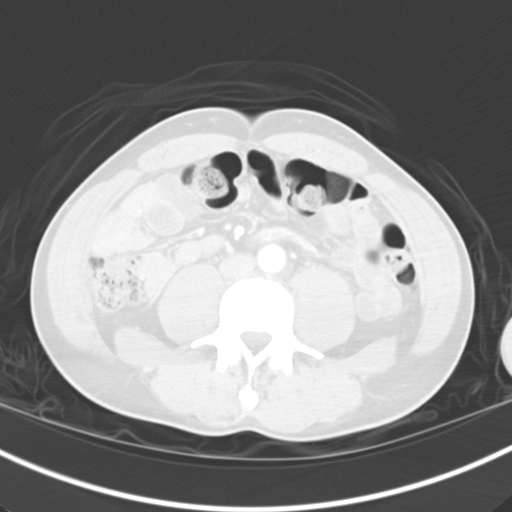
[im 58/128  mediastinal]
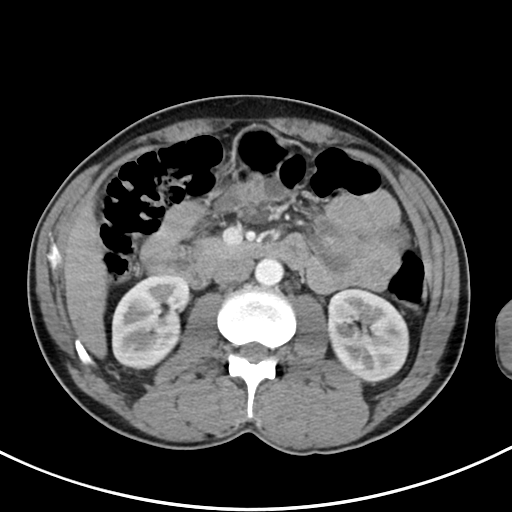
[im 58/128  lung]
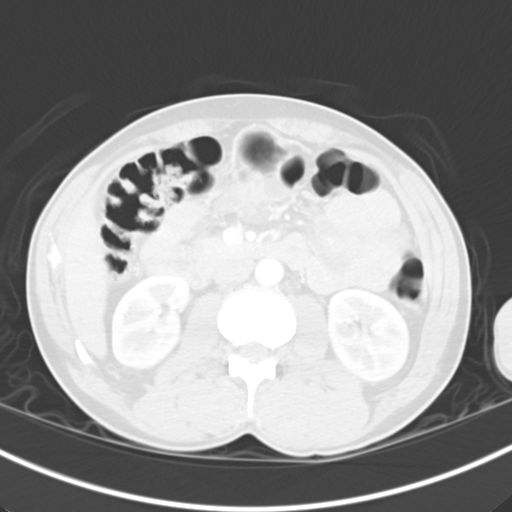
[im 70/128  lung]
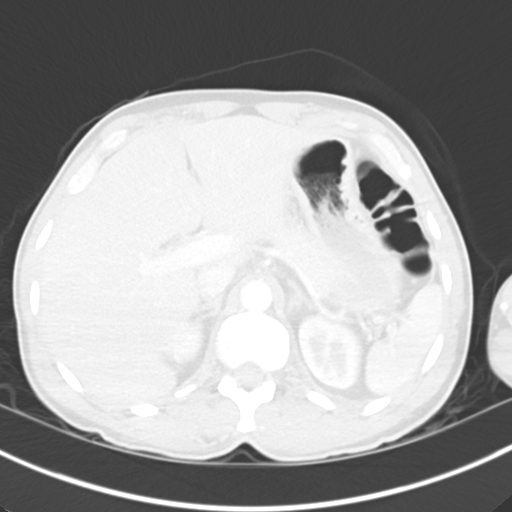
[im 81/128  lung]
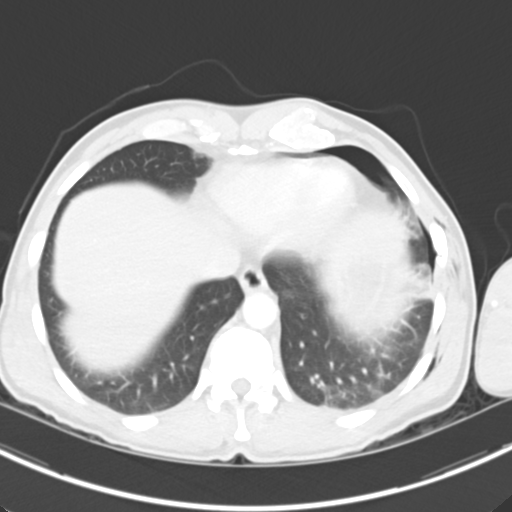
[im 93/128  lung]
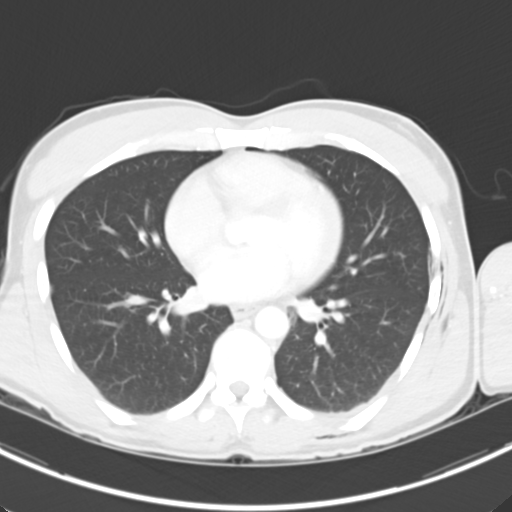
[im 104/128  mediastinal]
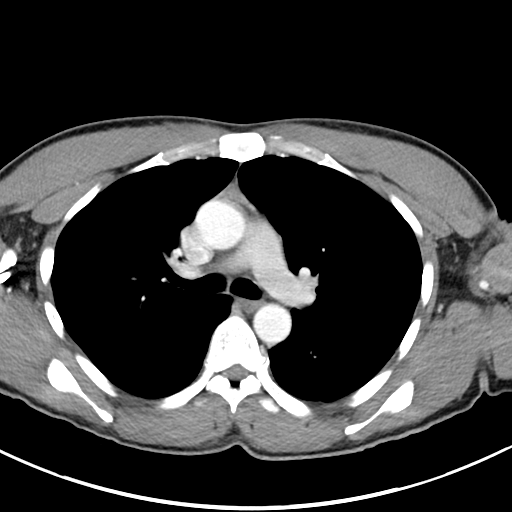
[im 104/128  lung]
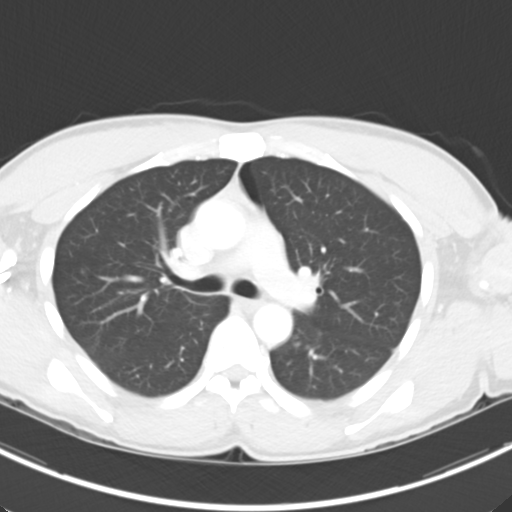
[im 116/128  lung]
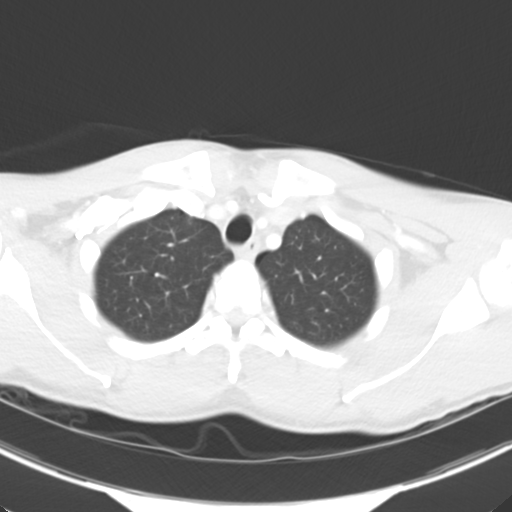

[Series 6: cor · coronal · 0.94mm/px · 3 of 82 slices shown]
[im 17/82  lung]
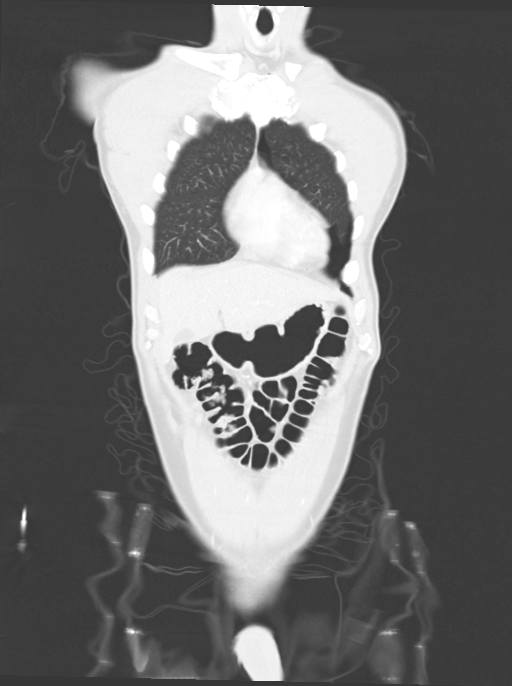
[im 33/82  lung]
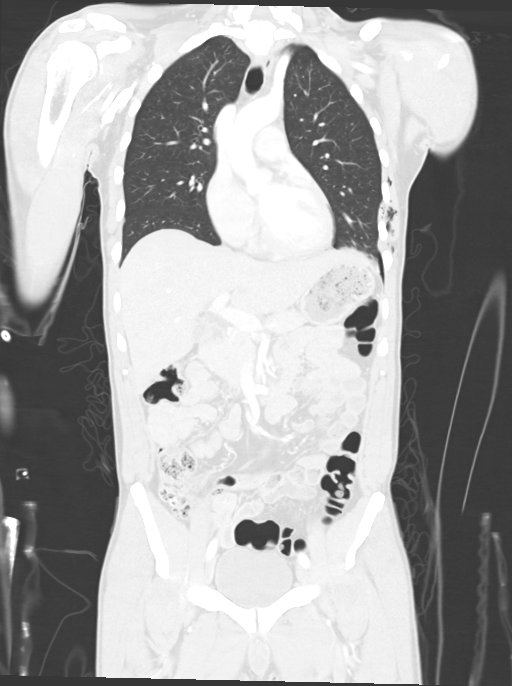
[im 49/82  lung]
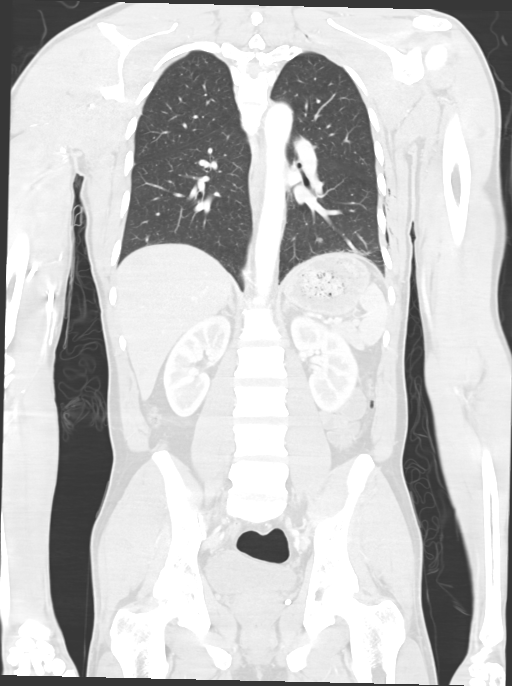

[13 of 36 positions shown; findings below may reference images not displayed]

FINDINGS: CT CHEST FINDINGS

Cardiovascular: No significant vascular findings. Normal heart size.
No pericardial effusion.

Mediastinum/Nodes: No enlarged mediastinal, hilar, or axillary lymph
nodes. Thyroid gland, trachea, and esophagus demonstrate no
significant findings.

Lungs/Pleura: Mild atelectasis is seen within the posterior and
lateral aspect of the left lung base.

There is no evidence of a pleural effusion.

A small anterior left-sided pneumothorax is seen. This measures
approximately 1.1 cm in maximum thickness and extends from the left
apex to the left lung base.

Musculoskeletal: Acute sixth and seventh lateral left rib fractures
are seen.

CT ABDOMEN PELVIS FINDINGS

Hepatobiliary: No focal liver abnormality is seen. No gallstones,
gallbladder wall thickening, or biliary dilatation.

Pancreas: Unremarkable. No pancreatic ductal dilatation or
surrounding inflammatory changes.

Spleen: Normal in size without focal abnormality.

Adrenals/Urinary Tract: Adrenal glands are unremarkable. Kidneys are
normal, without renal calculi, focal lesion, or hydronephrosis.
Bladder is unremarkable.

Stomach/Bowel: Stomach is within normal limits. The appendix is
poorly visualized. No evidence of bowel wall thickening, distention,
or inflammatory changes.

Vascular/Lymphatic: No significant vascular findings are present. No
enlarged abdominal or pelvic lymph nodes.

Reproductive: Prostate is unremarkable.

Other: No abdominal wall hernia or abnormality. No abdominopelvic
ascites.

Musculoskeletal: No acute or significant osseous findings.
IMPRESSION: 1. Small anterior left-sided pneumothorax.
2. Mild left basilar atelectasis. A small amount of pulmonary
contusion cannot be excluded.
3. Acute sixth and seventh lateral left rib fractures.
4. No evidence of acute or active process within the abdomen or
pelvis.
